# Patient Record
Sex: Female | Born: 1965 | Race: Black or African American | Hispanic: No | Marital: Single | Smoking: Never smoker
Health system: Southern US, Community
[De-identification: ages and names within clinical notes are randomized; demographics above are authoritative.]

## PROBLEM LIST (undated history)

## (undated) DIAGNOSIS — E119 Type 2 diabetes mellitus without complications: Secondary | ICD-10-CM

## (undated) DIAGNOSIS — I10 Essential (primary) hypertension: Secondary | ICD-10-CM

## (undated) HISTORY — PX: TUBAL LIGATION: SHX77

## (undated) HISTORY — PX: TONSILLECTOMY: SUR1361

## (undated) HISTORY — PX: FOOT SURGERY: SHX648

## (undated) HISTORY — PX: CHOLECYSTECTOMY: SHX55

## (undated) HISTORY — PX: THYROID SURGERY: SHX805

---

## 2003-11-19 ENCOUNTER — Emergency Department (HOSPITAL_COMMUNITY): Admission: EM | Admit: 2003-11-19 | Discharge: 2003-11-19 | Payer: Self-pay | Admitting: Emergency Medicine

## 2008-07-29 ENCOUNTER — Emergency Department (HOSPITAL_BASED_OUTPATIENT_CLINIC_OR_DEPARTMENT_OTHER): Admission: EM | Admit: 2008-07-29 | Discharge: 2008-07-29 | Payer: Self-pay | Admitting: Emergency Medicine

## 2008-07-29 ENCOUNTER — Ambulatory Visit: Payer: Self-pay | Admitting: Diagnostic Radiology

## 2008-08-29 ENCOUNTER — Emergency Department (HOSPITAL_BASED_OUTPATIENT_CLINIC_OR_DEPARTMENT_OTHER): Admission: EM | Admit: 2008-08-29 | Discharge: 2008-08-29 | Payer: Self-pay | Admitting: Emergency Medicine

## 2008-09-21 ENCOUNTER — Emergency Department (HOSPITAL_BASED_OUTPATIENT_CLINIC_OR_DEPARTMENT_OTHER): Admission: EM | Admit: 2008-09-21 | Discharge: 2008-09-21 | Payer: Self-pay | Admitting: Emergency Medicine

## 2010-01-25 ENCOUNTER — Emergency Department (HOSPITAL_BASED_OUTPATIENT_CLINIC_OR_DEPARTMENT_OTHER): Admission: EM | Admit: 2010-01-25 | Discharge: 2010-01-25 | Payer: Self-pay | Admitting: Emergency Medicine

## 2010-11-13 LAB — URINALYSIS, ROUTINE W REFLEX MICROSCOPIC
Nitrite: NEGATIVE
Specific Gravity, Urine: 1.021 (ref 1.005–1.030)
Urobilinogen, UA: 0.2 mg/dL (ref 0.0–1.0)

## 2010-11-13 LAB — URINE MICROSCOPIC-ADD ON

## 2010-11-13 LAB — PREGNANCY, URINE: Preg Test, Ur: NEGATIVE

## 2010-11-13 LAB — GLUCOSE, CAPILLARY

## 2010-11-23 ENCOUNTER — Emergency Department (HOSPITAL_BASED_OUTPATIENT_CLINIC_OR_DEPARTMENT_OTHER)
Admission: EM | Admit: 2010-11-23 | Discharge: 2010-11-23 | Disposition: A | Discharge status: Home / Self Care | Payer: Commercial Managed Care - PPO | Attending: Emergency Medicine | Admitting: Emergency Medicine

## 2010-11-23 DIAGNOSIS — H60399 Other infective otitis externa, unspecified ear: Secondary | ICD-10-CM | POA: Insufficient documentation

## 2010-11-23 DIAGNOSIS — H9209 Otalgia, unspecified ear: Secondary | ICD-10-CM | POA: Insufficient documentation

## 2010-11-23 DIAGNOSIS — I10 Essential (primary) hypertension: Secondary | ICD-10-CM | POA: Insufficient documentation

## 2011-02-21 ENCOUNTER — Emergency Department (INDEPENDENT_AMBULATORY_CARE_PROVIDER_SITE_OTHER): Payer: Commercial Managed Care - PPO

## 2011-02-21 ENCOUNTER — Emergency Department (HOSPITAL_BASED_OUTPATIENT_CLINIC_OR_DEPARTMENT_OTHER)
Admission: EM | Admit: 2011-02-21 | Discharge: 2011-02-21 | Disposition: A | Discharge status: Home / Self Care | Payer: Commercial Managed Care - PPO | Attending: Emergency Medicine | Admitting: Emergency Medicine

## 2011-02-21 ENCOUNTER — Other Ambulatory Visit (HOSPITAL_BASED_OUTPATIENT_CLINIC_OR_DEPARTMENT_OTHER): Payer: Self-pay | Admitting: Emergency Medicine

## 2011-02-21 ENCOUNTER — Encounter: Payer: Self-pay | Admitting: Emergency Medicine

## 2011-02-21 ENCOUNTER — Ambulatory Visit (INDEPENDENT_AMBULATORY_CARE_PROVIDER_SITE_OTHER)
Admit: 2011-02-21 | Discharge: 2011-02-21 | Disposition: A | Discharge status: Home / Self Care | Payer: Commercial Managed Care - PPO | Attending: Emergency Medicine | Admitting: Emergency Medicine

## 2011-02-21 ENCOUNTER — Emergency Department (HOSPITAL_BASED_OUTPATIENT_CLINIC_OR_DEPARTMENT_OTHER): Payer: Commercial Managed Care - PPO

## 2011-02-21 DIAGNOSIS — M25569 Pain in unspecified knee: Secondary | ICD-10-CM

## 2011-02-21 DIAGNOSIS — M79606 Pain in leg, unspecified: Secondary | ICD-10-CM

## 2011-02-21 DIAGNOSIS — M79609 Pain in unspecified limb: Secondary | ICD-10-CM | POA: Insufficient documentation

## 2011-02-21 DIAGNOSIS — I10 Essential (primary) hypertension: Secondary | ICD-10-CM | POA: Insufficient documentation

## 2011-02-21 DIAGNOSIS — M25559 Pain in unspecified hip: Secondary | ICD-10-CM | POA: Insufficient documentation

## 2011-02-21 DIAGNOSIS — R209 Unspecified disturbances of skin sensation: Secondary | ICD-10-CM

## 2011-02-21 HISTORY — DX: Essential (primary) hypertension: I10

## 2011-02-21 LAB — URINALYSIS, ROUTINE W REFLEX MICROSCOPIC
Bilirubin Urine: NEGATIVE
Protein, ur: NEGATIVE mg/dL
Urobilinogen, UA: 0.2 mg/dL (ref 0.0–1.0)

## 2011-02-21 LAB — BASIC METABOLIC PANEL
BUN: 11 mg/dL (ref 6–23)
Calcium: 9.5 mg/dL (ref 8.4–10.5)
GFR calc non Af Amer: >60 mL/min (ref >60)
Glucose, Bld: 140 mg/dL — ABNORMAL HIGH (ref 70–99)

## 2011-02-21 LAB — CBC
Hemoglobin: 11.2 g/dL — ABNORMAL LOW (ref 12.0–15.0)
MCH: 26.8 pg (ref 26.0–34.0)
MCHC: 33.1 g/dL (ref 30.0–36.0)
Platelets: 206 10*3/uL (ref 150–400)

## 2011-02-21 LAB — URINE MICROSCOPIC-ADD ON

## 2011-02-21 MED ORDER — OXYCODONE-ACETAMINOPHEN 5-325 MG PO TABS
1.0000 | ORAL_TABLET | Freq: Once | ORAL | Status: AC
  Administered 2011-02-21: 1 via ORAL
  Filled 2011-02-21: qty 1

## 2011-02-21 MED ORDER — ENOXAPARIN SODIUM 100 MG/ML ~~LOC~~ SOLN
100.0000 mg | Freq: Once | SUBCUTANEOUS | Status: AC
  Administered 2011-02-21: 100 mg via SUBCUTANEOUS

## 2011-02-21 MED ORDER — HYDROCODONE-ACETAMINOPHEN 5-500 MG PO TABS: 1.0000 | ORAL_TABLET | Freq: Four times a day (QID) | ORAL | Status: DC | PRN

## 2011-02-21 MED ORDER — HYDROCODONE-ACETAMINOPHEN 5-500 MG PO TABS: 1.0000 | ORAL_TABLET | Freq: Four times a day (QID) | ORAL | Status: AC | PRN

## 2011-02-21 MED ORDER — ENOXAPARIN SODIUM 150 MG/ML ~~LOC~~ SOLN: 1.5000 mg/kg | Freq: Once | SUBCUTANEOUS | Status: DC

## 2011-02-21 MED ORDER — ENOXAPARIN SODIUM 100 MG/ML ~~LOC~~ SOLN
SUBCUTANEOUS | Status: AC
  Filled 2011-02-21: qty 1

## 2011-02-21 NOTE — ED Provider Notes (Signed)
Dg Knee 1-2 Views Right  02/21/2011  *RADIOLOGY REPORT*  Clinical Data: Right knee pain anteriorly  RIGHT KNEE - 1-2 VIEW  Comparison: None.  Findings: Calcific density along the patellar tendon insertion versus fragmentation of the tibial tubercle.  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  No joint effusion.  No definite acute fracture or dislocation identified.  IMPRESSION: Calcific density near the patellar tendon insertion, may be sequelae of prior trauma or sequelae of unresolved Osgood-Schlatter disease.  Original Report Authenticated By: Waneta Martins, M.D.   US Venous Img Lower Unilateral Right  02/21/2011  *RADIOLOGY REPORT*  Clinical Data: Burning sensation involving the anterior aspect of the right knee radiating down the leg.  RIGHT LOWER EXTREMITY VENOUS DUPLEX ULTRASOUND  Technique:  Gray-scale sonography with graded compression, as well as color Doppler and duplex ultrasound were performed to evaluate the deep venous system of the lower extremity from the level of the common femoral vein through the popliteal and proximal calf veins. Spectral Doppler was utilized to evaluate flow at rest and with distal augmentation maneuvers.  Comparison:  None.  Findings:  Normal compressibility of the common femoral, superficial femoral, and popliteal veins is demonstrated, as well as the visualized proximal calf veins.  No filling defects to suggest DVT on grayscale or color Doppler imaging.  Doppler waveforms show normal direction of venous flow, normal respiratory phasicity and response to augmentation.  IMPRESSION: No evidence of right lower extremity deep vein thrombosis.  Original Report Authenticated By: Crawford Givens, M.D.    Patient return to the ED for followup ultrasound. Gave her the results of the study. No sign of DVT.  Celene Kras, MD 02/21/11 [phone]

## 2011-02-21 NOTE — ED Provider Notes (Signed)
History     Chief Complaint  Patient presents with  . Leg Pain   Patient is a 45 y.o. female presenting with leg pain. The history is provided by the patient. No language interpreter was used.  Leg Pain  The incident occurred more than 1 week ago. Incident location: no trauma. There was no injury mechanism. The pain is present in the right hip and right knee. The quality of the pain is described as burning. The pain is at a severity of 10/10. The pain is severe. The pain has been constant since onset. Pertinent negatives include no numbness, no inability to bear weight, no muscle weakness, no loss of sensation and no tingling. She reports no foreign bodies present. The symptoms are aggravated by nothing. She has tried nothing for the symptoms. The treatment provided no relief.    Past Medical History  Diagnosis Date  . Hypertension     Past Surgical History  Procedure Date  . Tonsillectomy   . Cholecystectomy   . Tubal ligation   . Thyroid surgery   . Foot surgery     History reviewed. No pertinent family history.  History  Substance Use Topics  . Smoking status: Never Smoker   . Smokeless tobacco: Not on file  . Alcohol Use: No    OB History    Grav Para Term Preterm Abortions TAB SAB Ect Mult Living                  Review of Systems  Constitutional: Negative for activity change.  HENT: Negative for facial swelling.   Eyes: Negative for discharge.  Respiratory: Negative for apnea, cough, chest tightness, shortness of breath, wheezing and stridor.   Cardiovascular: Negative for chest pain and leg swelling.  Gastrointestinal: Negative for abdominal distention.  Genitourinary: Negative for dysuria and difficulty urinating.  Musculoskeletal: Positive for arthralgias. Negative for back pain, joint swelling and gait problem.  Skin: Negative.   Neurological: Negative for tingling and numbness.  Hematological: Negative for adenopathy.  Psychiatric/Behavioral: Negative  for agitation.    Physical Exam  BP 168/104  Pulse 85  Temp(Src) 98.8 F (37.1 C) (Oral)  Resp 18  SpO2 100%  Physical Exam  Constitutional: She is oriented to person, place, and time. She appears well-developed and well-nourished.  HENT:  Head: Normocephalic and atraumatic.  Eyes: EOM are normal. Pupils are equal, round, and reactive to light.  Neck: Normal range of motion. Neck supple.  Cardiovascular: Normal rate and regular rhythm.   No murmur heard. Pulmonary/Chest: Effort normal and breath sounds normal. No respiratory distress.  Abdominal: Soft. Bowel sounds are normal. She exhibits no distension. There is no tenderness. There is no rebound.  Musculoskeletal: Normal range of motion. She exhibits no edema and no tenderness.       B knee exam: B negative anterior and posterior drawer tests, no deformation to varus or valgus stress, intact sensation to all nerve distributions of BLE. Cap refill in all toes < 2 sec.  DTR B knees 2+, no edema.  No homan's sign.  Intact internal external rotation and flexion and extension of B hips L5/s1 intact intact perineal sensation.  No spinal tenderness, gait intact  Neurological: She is alert and oriented to person, place, and time. She has normal reflexes. She displays normal reflexes. Coordination normal.  Skin: Skin is warm and dry. No rash noted. No erythema. No pallor.  Psychiatric: She has a normal mood and affect.    ED Course  Procedures  MDM Patient refused hip Xray.  Will schedule for DVT US in the am.  Intact creatinine.  Patient told to follow up for any concerns.  Verbalizes understanding and agrees to follow up in the am for [place]  Patient informed of the results of XRay verbatim from report.  No acute injury.  Patient verbalizes understanding but does not recall the previous trauma.  Feels improved and will follow up in the am for vascular ultrasound   Lalo Tromp K Sim Choquette-Rasch, MD 02/21/11 0345

## 2011-02-21 NOTE — ED Notes (Signed)
Pt c/o right leg pain from hip to foot. No known injury.

## 2011-06-06 ENCOUNTER — Other Ambulatory Visit (HOSPITAL_COMMUNITY)
Admission: RE | Admit: 2011-06-06 | Discharge: 2011-06-06 | Disposition: A | Discharge status: Home / Self Care | Payer: Commercial Indemnity | Source: Ambulatory Visit | Attending: Obstetrics and Gynecology | Admitting: Obstetrics and Gynecology

## 2011-06-06 DIAGNOSIS — Z124 Encounter for screening for malignant neoplasm of cervix: Secondary | ICD-10-CM | POA: Insufficient documentation

## 2011-06-06 DIAGNOSIS — Z1159 Encounter for screening for other viral diseases: Secondary | ICD-10-CM | POA: Insufficient documentation

## 2011-11-26 ENCOUNTER — Emergency Department (HOSPITAL_BASED_OUTPATIENT_CLINIC_OR_DEPARTMENT_OTHER)
Admission: EM | Admit: 2011-11-26 | Discharge: 2011-11-27 | Disposition: A | Discharge status: Home / Self Care | Payer: Self-pay | Attending: Emergency Medicine | Admitting: Emergency Medicine

## 2011-11-26 ENCOUNTER — Emergency Department (HOSPITAL_BASED_OUTPATIENT_CLINIC_OR_DEPARTMENT_OTHER): Payer: Self-pay

## 2011-11-26 ENCOUNTER — Encounter (HOSPITAL_BASED_OUTPATIENT_CLINIC_OR_DEPARTMENT_OTHER): Payer: Self-pay | Admitting: Student

## 2011-11-26 DIAGNOSIS — M2550 Pain in unspecified joint: Secondary | ICD-10-CM | POA: Insufficient documentation

## 2011-11-26 DIAGNOSIS — M79609 Pain in unspecified limb: Secondary | ICD-10-CM | POA: Insufficient documentation

## 2011-11-26 DIAGNOSIS — M7989 Other specified soft tissue disorders: Secondary | ICD-10-CM | POA: Insufficient documentation

## 2011-11-26 DIAGNOSIS — Z79899 Other long term (current) drug therapy: Secondary | ICD-10-CM | POA: Insufficient documentation

## 2011-11-26 DIAGNOSIS — L03019 Cellulitis of unspecified finger: Secondary | ICD-10-CM

## 2011-11-26 DIAGNOSIS — I10 Essential (primary) hypertension: Secondary | ICD-10-CM | POA: Insufficient documentation

## 2011-11-26 MED ORDER — ACETAMINOPHEN-CODEINE #3 300-30 MG PO TABS: 1.0000 | ORAL_TABLET | Freq: Four times a day (QID) | ORAL | Status: AC | PRN

## 2011-11-26 MED ORDER — DICLOXACILLIN SODIUM 500 MG PO CAPS: 500.0000 mg | ORAL_CAPSULE | Freq: Three times a day (TID) | ORAL | Status: AC

## 2011-11-26 MED ORDER — HYDROCODONE-ACETAMINOPHEN 5-325 MG PO TABS
2.0000 | ORAL_TABLET | Freq: Once | ORAL | Status: AC
  Administered 2011-11-27: 2 via ORAL
  Filled 2011-11-26: qty 2

## 2011-11-26 NOTE — ED Provider Notes (Signed)
History     CSN: [id number]  Arrival date & time 11/26/11  2134   First MD Initiated Contact with Patient 11/26/11 2306      Chief Complaint  Patient presents with  . Hand Pain    left thumb swelling    (Consider location/radiation/quality/duration/timing/severity/associated sxs/prior treatment) HPI Comments: Pt reports she has had similar problem with pain, swelling and redness to thumb in the past, she attributes to being a nail biter.  At [facility], she had to be numbed and incised to release pus. Has been worsening for a few days.  No fevers.  She used ice to it yesterday and got a small amount of pus to come out, but remains red, swollen and painful.   Mostly affects the ulnar side of thumb.ROM is normal.  No pain to hand, wrist.    Patient is a 46 y.o. female presenting with hand pain. The history is provided by the patient.  Hand Pain    Past Medical History  Diagnosis Date  . Hypertension     Past Surgical History  Procedure Date  . Tonsillectomy   . Cholecystectomy   . Tubal ligation   . Thyroid surgery   . Foot surgery     History reviewed. No pertinent family history.  History  Substance Use Topics  . Smoking status: Never Smoker   . Smokeless tobacco: Not on file  . Alcohol Use: No    OB History    Grav Para Term Preterm Abortions TAB SAB Ect Mult Living                  Review of Systems  Constitutional: Negative.   Musculoskeletal: Positive for arthralgias.  Skin: Positive for wound. Negative for rash.  Neurological: Negative for weakness and numbness.    Allergies  Review of patient's allergies indicates no known allergies.  Home Medications   Current Outpatient Rx  Name Route Sig Dispense Refill  . HYDROCHLOROTHIAZIDE 12.5 MG PO CAPS Oral Take 12.5 mg by mouth daily.    . ACETAMINOPHEN-CODEINE #3 300-30 MG PO TABS Oral Take 1-2 tablets by mouth every 6 (six) hours as needed for pain. 15 tablet 0  . AMOXICILLIN 500 MG PO CAPS Oral  Take by mouth.      . ATENOLOL 100 MG PO TABS Oral Take by mouth daily.      Marland Kitchen DICLOXACILLIN SODIUM 500 MG PO CAPS Oral Take 1 capsule (500 mg total) by mouth 3 (three) times daily. 21 capsule 0    BP 153/82  Pulse 82  Temp(Src) 98.1 F (36.7 C) (Oral)  Resp 20  SpO2 100%  LMP 09/26/2011  Physical Exam  Nursing note and vitals reviewed. Constitutional: She appears well-developed. No distress.  Musculoskeletal: She exhibits tenderness.       Hands: Neurological: She is alert.  Skin: Skin is warm and dry. No rash noted. She is not diaphoretic.    ED Course  Procedures (including critical care time)  Labs Reviewed - No data to display No results found.   1. Paronychia of thumb       MDM  Discussed performing a digital block in order to do a finger procedure to try to incise along margin of nail to assess for abscess.  Pt would rather try conservative warm soaks at home, oral abx and analgesics for now . She is told to return for worsening symptoms, or if symptoms are not improving with above therapies.  She is also given option  to see PCP or urgent care as well.  She may require nail partial removal if ingrown nail is present.          Gavin Pound. Oletta Lamas, MD 11/26/11 [phone]

## 2011-11-26 NOTE — Discharge Instructions (Signed)
Paronychia Paronychia is an inflammatory reaction involving the folds of the skin surrounding the fingernail. This is commonly caused by an infection in the skin around a nail. The most common cause of paronychia is frequent wetting of the hands (as seen with bartenders, food servers, nurses or others who wet their hands). This makes the skin around the fingernail susceptible to infection by bacteria (germs) or fungus. Other predisposing factors are:  Aggressive manicuring.   Nail biting.   Thumb sucking.  The most common cause is a staphylococcal (a type of germ) infection, or a fungal (Candida) infection. When caused by a germ, it usually comes on suddenly with redness, swelling, pus and is often painful. It may get under the nail and form an abscess (collection of pus), or form an abscess around the nail. If the nail itself is infected with a fungus, the treatment is usually prolonged and may require oral medicine for up to one year. Your caregiver will determine the length of time treatment is required. The paronychia caused by bacteria (germs) may largely be avoided by not pulling on hangnails or picking at cuticles. When the infection occurs at the tips of the finger it is called felon. When the cause of paronychia is from the herpes simplex virus (HSV) it is called herpetic whitlow. TREATMENT  When an abscess is present treatment is often incision and drainage. This means that the abscess must be cut open so the pus can get out. When this is done, the following home care instructions should be followed. HOME CARE INSTRUCTIONS   It is important to keep the affected fingers very dry. Rubber or plastic gloves over cotton gloves should be used whenever the hand must be placed in water.   Keep wound clean, dry and dressed as suggested by your caregiver between warm soaks or warm compresses.   Soak in warm water for fifteen to twenty minutes three to four times per day for bacterial infections.  Fungal infections are very difficult to treat, so often require treatment for long periods of time.   For bacterial (germ) infections take antibiotics (medicine which kill germs) as directed and finish the prescription, even if the problem appears to be solved before the medicine is gone.   Only take over-the-counter or prescription medicines for pain, discomfort, or fever as directed by your caregiver.  SEEK IMMEDIATE MEDICAL CARE IF:  You have redness, swelling, or increasing pain in the wound.   You have a fever.  Document Released: 01/08/2001 Document Revised: 07/04/2011 Document Reviewed: 09/09/2008 [facility] Patient Information 2012 [place], [place].

## 2011-11-26 NOTE — ED Notes (Signed)
Swelling and pain to left thumb

## 2013-01-02 DIAGNOSIS — I10 Essential (primary) hypertension: Secondary | ICD-10-CM | POA: Insufficient documentation

## 2013-01-02 DIAGNOSIS — J302 Other seasonal allergic rhinitis: Secondary | ICD-10-CM | POA: Insufficient documentation

## 2013-01-02 DIAGNOSIS — J4520 Mild intermittent asthma, uncomplicated: Secondary | ICD-10-CM | POA: Insufficient documentation

## 2013-07-14 ENCOUNTER — Emergency Department (HOSPITAL_BASED_OUTPATIENT_CLINIC_OR_DEPARTMENT_OTHER): Payer: Self-pay

## 2013-07-14 ENCOUNTER — Emergency Department (HOSPITAL_BASED_OUTPATIENT_CLINIC_OR_DEPARTMENT_OTHER)
Admission: EM | Admit: 2013-07-14 | Discharge: 2013-07-14 | Disposition: A | Discharge status: Home / Self Care | Payer: Self-pay | Attending: Emergency Medicine | Admitting: Emergency Medicine

## 2013-07-14 ENCOUNTER — Encounter (HOSPITAL_BASED_OUTPATIENT_CLINIC_OR_DEPARTMENT_OTHER): Payer: Self-pay | Admitting: Emergency Medicine

## 2013-07-14 DIAGNOSIS — H9209 Otalgia, unspecified ear: Secondary | ICD-10-CM | POA: Insufficient documentation

## 2013-07-14 DIAGNOSIS — Z79899 Other long term (current) drug therapy: Secondary | ICD-10-CM | POA: Insufficient documentation

## 2013-07-14 DIAGNOSIS — Z9889 Other specified postprocedural states: Secondary | ICD-10-CM | POA: Insufficient documentation

## 2013-07-14 DIAGNOSIS — I10 Essential (primary) hypertension: Secondary | ICD-10-CM | POA: Insufficient documentation

## 2013-07-14 DIAGNOSIS — J069 Acute upper respiratory infection, unspecified: Secondary | ICD-10-CM | POA: Insufficient documentation

## 2013-07-14 MED ORDER — IBUPROFEN 400 MG PO TABS
400.0000 mg | ORAL_TABLET | Freq: Once | ORAL | Status: AC
  Administered 2013-07-14: 400 mg via ORAL

## 2013-07-14 MED ORDER — IBUPROFEN 200 MG PO TABS
ORAL_TABLET | ORAL | Status: AC
  Administered 2013-07-14: 400 mg via ORAL
  Filled 2013-07-14: qty 2

## 2013-07-14 MED ORDER — ALBUTEROL SULFATE HFA 108 (90 BASE) MCG/ACT IN AERS
2.0000 | INHALATION_SPRAY | RESPIRATORY_TRACT | Status: DC | PRN
  Administered 2013-07-14: 2 via RESPIRATORY_TRACT
  Filled 2013-07-14: qty 6.7

## 2013-07-14 MED ORDER — HYDROCOD POLST-CHLORPHEN POLST 10-8 MG/5ML PO LQCR: 5.0000 mL | Freq: Two times a day (BID) | ORAL | Status: DC | PRN

## 2013-07-14 NOTE — ED Notes (Signed)
Onset of cough Sunday.  C/o chest congestion.  C/o left ear pain also.  Reports fever at home, using ibuprofen.

## 2013-07-14 NOTE — ED Provider Notes (Signed)
CSN: [id number]     Arrival date & time 07/14/13  2040 History  This chart was scribed for Geoffery Lyons, MD by Landis Gandy, ED Scribe. This patient was seen in room MH01/MH01 and the patient's care was started at 8:56 PM.    Chief Complaint  Patient presents with  . Cough    The history is provided by the patient. No language interpreter was used.   HPI Comments: Krystal Castillo is a 47 y.o. female who presents to the Emergency Department complaining of a new, constant, gradually worsening productive cough onset, three days. She states that she takes cough medicine to relieve the symptoms. She reports that her associated symptoms are a fever, difficulty breathing, headaches, and ear pain without drainage. She denies having a flu shot this year.     Past Medical History  Diagnosis Date  . Hypertension    Past Surgical History  Procedure Laterality Date  . Tonsillectomy    . Cholecystectomy    . Tubal ligation    . Thyroid surgery    . Foot surgery     No family history on file. History  Substance Use Topics  . Smoking status: Never Smoker   . Smokeless tobacco: Not on file  . Alcohol Use: No   OB History   Grav Para Term Preterm Abortions TAB SAB Ect Mult Living                 Review of Systems A complete 10 system review of systems was obtained and all systems are negative except as noted in the HPI and PMH.    Allergies  Review of patient's allergies indicates no known allergies.  Home Medications   Current Outpatient Rx  Name  Route  Sig  Dispense  Refill  . atenolol (TENORMIN) 100 MG tablet   Oral   Take by mouth daily.           . hydrochlorothiazide (MICROZIDE) 12.5 MG capsule   Oral   Take 12.5 mg by mouth daily.          Triage Vitals: BP 187/82  Pulse 96  Temp(Src) 100.2 F (37.9 C) (Oral)  Resp 20  Ht 5\' 6"  (1.676 m)  Wt 232 lb (105.235 kg)  BMI 37.46 kg/m2  SpO2 100%  LMP 07/04/2013 Physical Exam  Nursing note and vitals  reviewed. Constitutional: She is oriented to person, place, and time. She appears well-developed and well-nourished. No distress.  HENT:  Head: Normocephalic and atraumatic.  Bilateral TM's are clear.   Eyes: Conjunctivae and EOM are normal. No scleral icterus.  Neck: Normal range of motion.  Cardiovascular: Normal rate, regular rhythm and normal heart sounds.   Pulmonary/Chest: Effort normal and breath sounds normal. No respiratory distress. She has no wheezes.  Musculoskeletal: Normal range of motion.  Neurological: She is alert and oriented to person, place, and time.  Skin: Skin is warm and dry. No rash noted. She is not diaphoretic. No erythema. No pallor.  Psychiatric: She has a normal mood and affect. Her behavior is normal.    ED Course  Procedures (including critical care time) DIAGNOSTIC STUDIES: Oxygen Saturation is 100% on RA, normal by my interpretation.    COORDINATION OF CARE: 8:59 PM- CXR ordered. Will discharge patient with congestion medication. Pt advised of plan for treatment and pt agrees.  Labs Review Labs Reviewed - No data to display Imaging Review No results found.    MDM  No diagnosis found. Patient presents with  complaints of cough and congestion for the past 3 or 4 days. Her lungs are clear and chest x-ray does not reveal pneumonia. She is febrile in the emergency department and I suspect this is a viral process. I will prescribe a cough syrup and an albuterol inhaler to use as needed. She is to return to the ER if her symptoms worsen or she develops new or concerning symptoms.    I personally performed the services described in this documentation, which was scribed in my presence. The recorded information has been reviewed and is accurate.        Geoffery Lyons, MD 07/14/13 2138

## 2013-07-14 NOTE — ED Notes (Signed)
Patient transported to X-ray 

## 2014-09-16 ENCOUNTER — Emergency Department (HOSPITAL_BASED_OUTPATIENT_CLINIC_OR_DEPARTMENT_OTHER)
Admission: EM | Admit: 2014-09-16 | Discharge: 2014-09-16 | Disposition: A | Discharge status: Home / Self Care | Payer: 59 | Attending: Emergency Medicine | Admitting: Emergency Medicine

## 2014-09-16 ENCOUNTER — Encounter (HOSPITAL_BASED_OUTPATIENT_CLINIC_OR_DEPARTMENT_OTHER): Payer: Self-pay

## 2014-09-16 ENCOUNTER — Emergency Department (HOSPITAL_BASED_OUTPATIENT_CLINIC_OR_DEPARTMENT_OTHER): Payer: 59

## 2014-09-16 DIAGNOSIS — Z79899 Other long term (current) drug therapy: Secondary | ICD-10-CM | POA: Diagnosis not present

## 2014-09-16 DIAGNOSIS — M79604 Pain in right leg: Secondary | ICD-10-CM | POA: Insufficient documentation

## 2014-09-16 DIAGNOSIS — R2241 Localized swelling, mass and lump, right lower limb: Secondary | ICD-10-CM | POA: Diagnosis present

## 2014-09-16 DIAGNOSIS — I10 Essential (primary) hypertension: Secondary | ICD-10-CM | POA: Diagnosis not present

## 2014-09-16 MED ORDER — TRAMADOL HCL 50 MG PO TABS: 50.0000 mg | ORAL_TABLET | Freq: Four times a day (QID) | ORAL | Status: DC | PRN

## 2014-09-16 NOTE — Discharge Instructions (Signed)
Ibuprofen 600 mg 3 times daily for the next 5 days.  Tramadol as prescribed as needed for pain not relieved with ibuprofen.  Follow-up with your primary Dr. if not improving in the next week, and return to the ER if your symptoms substantially worsen or change.   Musculoskeletal Pain Musculoskeletal pain is muscle and boney aches and pains. These pains can occur in any part of the body. Your caregiver may treat you without knowing the cause of the pain. They may treat you if blood or urine tests, X-rays, and other tests were normal.  CAUSES There is often not a definite cause or reason for these pains. These pains may be caused by a type of germ (virus). The discomfort may also come from overuse. Overuse includes working out too hard when your body is not fit. Boney aches also come from weather changes. Bone is sensitive to atmospheric pressure changes. HOME CARE INSTRUCTIONS   Ask when your test results will be ready. Make sure you get your test results.  Only take over-the-counter or prescription medicines for pain, discomfort, or fever as directed by your caregiver. If you were given medications for your condition, do not drive, operate machinery or power tools, or sign legal documents for 24 hours. Do not drink alcohol. Do not take sleeping pills or other medications that may interfere with treatment.  Continue all activities unless the activities cause more pain. When the pain lessens, slowly resume normal activities. Gradually increase the intensity and duration of the activities or exercise.  During periods of severe pain, bed rest may be helpful. Lay or sit in any position that is comfortable.  Putting ice on the injured area.  Put ice in a bag.  Place a towel between your skin and the bag.  Leave the ice on for 15 to 20 minutes, 3 to 4 times a day.  Follow up with your caregiver for continued problems and no reason can be found for the pain. If the pain becomes worse or does not  go away, it may be necessary to repeat tests or do additional testing. Your caregiver may need to look further for a possible cause. SEEK IMMEDIATE MEDICAL CARE IF:  You have pain that is getting worse and is not relieved by medications.  You develop chest pain that is associated with shortness or breath, sweating, feeling sick to your stomach (nauseous), or throw up (vomit).  Your pain becomes localized to the abdomen.  You develop any new symptoms that seem different or that concern you. MAKE SURE YOU:   Understand these instructions.  Will watch your condition.  Will get help right away if you are not doing well or get worse. Document Released: 07/15/2005 Document Revised: 10/07/2011 Document Reviewed: 03/19/2013 [facility] Patient Information 2015 [place], [place]. This information is not intended to replace advice given to you by your health care provider. Make sure you discuss any questions you have with your health care provider.

## 2014-09-16 NOTE — ED Notes (Signed)
C/o pain, swelling to right leg x 1-1.5 weeks-denies injury

## 2014-09-16 NOTE — ED Provider Notes (Signed)
CSN: [id number]     Arrival date & time 09/16/14  2047 History  This chart was scribed for Geoffery Lyonsouglas Carel Carrier, MD by Annye AsaAnna Dorsett, ED Scribe. This patient was seen in room MH10/MH10 and the patient's care was started at 9:52 PM.    Chief Complaint  Patient presents with  . Leg Swelling   The history is provided by the patient. No language interpreter was used.     HPI Comments: Krystal Castillo is a 49 y.o. female with past medical history of HTN who presents to the Emergency Department complaining of 10 days of sharp, "shooting" right leg pain and swelling (worst in right quad, extending down to the right ankle). Her pain is worse when standing. After extended movement, the sensation is described as "burning." She denies chest pain, SOB.   Past Medical History  Diagnosis Date  . Hypertension    Past Surgical History  Procedure Laterality Date  . Tonsillectomy    . Cholecystectomy    . Tubal ligation    . Thyroid surgery    . Foot surgery     No family history on file. History  Substance Use Topics  . Smoking status: Never Smoker   . Smokeless tobacco: Not on file  . Alcohol Use: No   OB History    No data available     Review of Systems  A complete 10 system review of systems was obtained and all systems are negative except as noted in the HPI and PMH.    Allergies  Review of patient's allergies indicates no known allergies.  Home Medications   Prior to Admission medications   Medication Sig Start Date End Date Taking? Authorizing Provider  atenolol (TENORMIN) 100 MG tablet Take by mouth daily.      Historical Provider, MD  hydrochlorothiazide (MICROZIDE) 12.5 MG capsule Take 12.5 mg by mouth daily.    Historical Provider, MD   BP 156/90 mmHg  Pulse 78  Temp(Src) 99 F (37.2 C) (Oral)  Resp 18  Ht 5\' 6"  (1.676 m)  Wt 230 lb (104.327 kg)  BMI 37.14 kg/m2  SpO2 100%  LMP 08/26/2014 Physical Exam  Constitutional: She is oriented to person, place, and time. She appears  well-developed and well-nourished. No distress.  HENT:  Head: Normocephalic and atraumatic.  Mouth/Throat: Oropharynx is clear and moist. No oropharyngeal exudate.  Eyes: EOM are normal. Pupils are equal, round, and reactive to light.  Neck: Normal range of motion. Neck supple.  Cardiovascular: Normal rate, regular rhythm and normal heart sounds.  Exam reveals no gallop and no friction rub.   No murmur heard. Pulmonary/Chest: Effort normal. No respiratory distress. She has no wheezes. She has no rales.  Abdominal: Soft. There is no tenderness. There is no rebound and no guarding.  Musculoskeletal: Normal range of motion. She exhibits no edema or tenderness.  Right lower extremity does not appear significantly swollen. There is no calf tenderness and Homan's sign is absent. Distal pulses, motor and sensory are intact.   Neurological: She is alert and oriented to person, place, and time.  Skin: Skin is warm and dry. No rash noted.  Psychiatric: She has a normal mood and affect. Her behavior is normal.  Nursing note and vitals reviewed.   ED Course  Procedures   DIAGNOSTIC STUDIES: Oxygen Saturation is 100% on RA, normal by my interpretation.    COORDINATION OF CARE: 9:58 PM Discussed treatment plan with pt at bedside and pt agreed to plan.   Labs  Review Labs Reviewed - No data to display  Imaging Review No results found.   EKG Interpretation None      MDM   Final diagnoses:  None    Patient presents with a ten-day history of pain and swelling to her right leg. Her pain is from just above the knee all the way down to the ankle. This started in the absence of any injury or trauma. Her vital signs and physical examination is essentially unremarkable. I am unable to appreciate any significant swelling. Her ultrasound is negative for DVT. I suspect some musculoskeletal etiology and will recommend anti-inflammatories and when necessary return.   I personally performed the  services described in this documentation, which was scribed in my presence. The recorded information has been reviewed and is accurate.      Geoffery Lyons, MD 09/16/14 2306

## 2015-04-24 ENCOUNTER — Encounter (HOSPITAL_BASED_OUTPATIENT_CLINIC_OR_DEPARTMENT_OTHER): Payer: Self-pay | Admitting: *Deleted

## 2015-04-24 ENCOUNTER — Emergency Department (HOSPITAL_BASED_OUTPATIENT_CLINIC_OR_DEPARTMENT_OTHER)
Admission: EM | Admit: 2015-04-24 | Discharge: 2015-04-24 | Disposition: A | Discharge status: Home / Self Care | Payer: 59 | Attending: Physician Assistant | Admitting: Physician Assistant

## 2015-04-24 DIAGNOSIS — K0381 Cracked tooth: Secondary | ICD-10-CM | POA: Insufficient documentation

## 2015-04-24 DIAGNOSIS — Z79899 Other long term (current) drug therapy: Secondary | ICD-10-CM | POA: Diagnosis not present

## 2015-04-24 DIAGNOSIS — I10 Essential (primary) hypertension: Secondary | ICD-10-CM | POA: Diagnosis not present

## 2015-04-24 DIAGNOSIS — K0889 Other specified disorders of teeth and supporting structures: Secondary | ICD-10-CM

## 2015-04-24 DIAGNOSIS — K088 Other specified disorders of teeth and supporting structures: Secondary | ICD-10-CM | POA: Insufficient documentation

## 2015-04-24 MED ORDER — PENICILLIN V POTASSIUM 500 MG PO TABS: 500.0000 mg | ORAL_TABLET | Freq: Four times a day (QID) | ORAL | Status: DC

## 2015-04-24 MED ORDER — NAPROXEN 250 MG PO TABS
250.0000 mg | ORAL_TABLET | Freq: Once | ORAL | Status: AC
  Administered 2015-04-24: 250 mg via ORAL
  Filled 2015-04-24: qty 1

## 2015-04-24 MED ORDER — NAPROXEN 250 MG PO TABS
250.0000 mg | ORAL_TABLET | Freq: Two times a day (BID) | ORAL | Status: DC
Start: 2015-04-24 — End: 2015-04-24

## 2015-04-24 MED ORDER — NAPROXEN 250 MG PO TABS: 250.0000 mg | ORAL_TABLET | Freq: Two times a day (BID) | ORAL | Status: DC

## 2015-04-24 NOTE — Discharge Instructions (Signed)
Dental Pain °A tooth ache may be caused by cavities (tooth decay). Cavities expose the nerve of the tooth to air and hot or cold temperatures. It may come from an infection or abscess (also called a boil or furuncle) around your tooth. It is also often caused by dental caries (tooth decay). This causes the pain you are having. °DIAGNOSIS  °Your caregiver can diagnose this problem by exam. °TREATMENT  °· If caused by an infection, it may be treated with medications which kill germs (antibiotics) and pain medications as prescribed by your caregiver. Take medications as directed. °· Only take over-the-counter or prescription medicines for pain, discomfort, or fever as directed by your caregiver. °· Whether the tooth ache today is caused by infection or dental disease, you should see your dentist as soon as possible for further care. °SEEK MEDICAL CARE IF: °The exam and treatment you received today has been provided on an emergency basis only. This is not a substitute for complete medical or dental care. If your problem worsens or new problems (symptoms) appear, and you are unable to meet with your dentist, call or return to this location. °SEEK IMMEDIATE MEDICAL CARE IF:  °· You have a fever. °· You develop redness and swelling of your face, jaw, or neck. °· You are unable to open your mouth. °· You have severe pain uncontrolled by pain medicine. °MAKE SURE YOU:  °· Understand these instructions. °· Will watch your condition. °· Will get help right away if you are not doing well or get worse. °Document Released: 07/15/2005 Document Revised: 10/07/2011 Document Reviewed: 03/02/2008 °ExitCare® Patient Information ©2015 [employer]. This information is not intended to replace advice given to you by your health care provider. Make sure you discuss any questions you have with your health care provider. ° °Emergency Department Resource Guide °1) Find a Doctor and Pay Out of Pocket °Although you won't have to find out who  is covered by your insurance plan, it is a good idea to ask around and get recommendations. You will then need to call the office and see if the doctor you have chosen will accept you as a new patient and what types of options they offer for patients who are self-pay. Some doctors offer discounts or will set up payment plans for their patients who do not have insurance, but you will need to ask so you aren't surprised when you get to your appointment. ° °2) Contact Your Local Health Department °Not all health departments have doctors that can see patients for sick visits, but many do, so it is worth a call to see if yours does. If you don't know where your local health department is, you can check in your phone book. The CDC also has a tool to help you locate your state's health department, and many state websites also have listings of all of their local health departments. ° °3) Find a Walk-in Clinic °If your illness is not likely to be very severe or complicated, you may want to try a walk in clinic. These are popping up all over the country in pharmacies, drugstores, and shopping centers. They're usually staffed by nurse practitioners or physician assistants that have been trained to treat common illnesses and complaints. They're usually fairly quick and inexpensive. However, if you have serious medical issues or chronic medical problems, these are probably not your best option. ° °No Primary Care Doctor: °- Call [facility] at  832-8000 - they can help you locate a primary   care doctor that  accepts your insurance, provides certain services, etc. °- Physician Referral Service- 1-800-533-3463 ° °Chronic Pain Problems: °Organization         Address  Phone   Notes  °[facility]  (336) 297-2271 Patients need to be referred by their primary care doctor.  ° °Medication Assistance: °Organization         Address  Phone   Notes  °[place] [facility] [address]  [address] °[place], [place] [address] (336) 641-8030 --Must be a resident of [place] °-- Must have NO insurance coverage whatsoever (no Medicaid/ Medicare, etc.) °-- The pt. MUST have a primary care doctor that directs their care regularly and follows them in the community °  °[facility]  (866) 331-1348   °[employer]  (888) 892-1162   ° °Agencies that provide inexpensive medical care: °Organization         Address  Phone   Notes  °[facility]  (336) 832-8035   °[facility]    (336) 832-7272   °[facility] [address] °[place], [place] [address] (336) 832-4777   °[facility] [address], °[place] (336) 271-4999   °[facility]    (336) 373-0678   °[facility]    (336) 272-1050   °[facility] ° [address], [place] Phone:  (336) 832-4444, Fax:  (336) 832-4440 Hours of Operation:  9 am - 6 pm, M-F.  Also accepts Medicaid/Medicare and self-pay.  °[facility] ° [address], [place] Phone: (336) 832-3150, Fax: (336) 832-3151. Hours of Operation:  8:30 am - 5:30 pm, M-F.  Also accepts Medicaid and self-pay.  °[facility] [place] [address], [place] Phone: (336) 878-6027   °[facility] [address], [place], [place] (336)723-1848, Ext. 123 Mondays & Thursdays: 7-9 AM.  First 15 patients are seen on a first come, first serve basis. °  ° °Medicaid-accepting [place] Providers: ° °Organization         Address  Phone   Notes  °[facility] [address], [place] (336) 641-2100 Also accepts self-pay patients.  °[facility] [address], [place] ° (336) 856-9996   °[facility] [address], [place] (336) 288-8857   °[facility] [address] [place] [address], [place] (336) 299-7000   °Veita Bland [address], [place]  ° (336) 373-1557 Only accepts [place] Access Medicaid patients after they have their name applied to their card.  ° °Self-Pay (no insurance) in [place]: ° °Organization         Address  Phone   Notes  °Sickle Cell Patients, [facility] [address], [place] (336) 832-1970   °[facility] [address], [place] (336) 832-4400   ° [facility] [place] ° [address], [place] (336) 992-4800   °[facility]/Dr. Osei-Bonsu ° [address] [place] [address], [place] or [address], [place] (336) 841-8500 Phone number for both [place] and [place] locations is the same.  °[facility] [address], [place] (336) 299-0000   °[facility] [address] [place] [address],  or [address] (336) 852-7530 °(336) 878-2260   °  [facility] [address], [place] (336) 350-1642, phone; (336) 294-5005, fax Sees patients 1st and 3rd Saturday of every month.  Must not qualify for public or private insurance (i.e. Medicaid, Medicare, [place] Health Choice, Veterans' Benefits) • Household income should be no more than 200% of the poverty level •The clinic cannot treat you if you are pregnant or think you are pregnant • Sexually transmitted diseases are not treated at the clinic.  ° ° °Dental Care: °Organization         Address  Phone  Notes  °[facility] [address], [place] (336) 641-6152 Accepts children up to age 21 who are enrolled in Medicaid or [place] Health Choice; pregnant women with a Medicaid card; and children who have applied for Medicaid or [place] Health Choice, but were declined, whose parents can pay a reduced fee at time of service.  °[facility] [place]  [address], [place] (336) 641-7733 Accepts children up to age 21 who are  enrolled in Medicaid or [place] Health Choice; pregnant women with a Medicaid card; and children who have applied for Medicaid or [place] Health Choice, but were declined, whose parents can pay a reduced fee at time of service.  °[facility] ° [address], [place] (336) 641-4533 Patients are seen by appointment only. Walk-ins are not accepted. [facility] will see patients 18 years of age and older. °Monday - Tuesday (8am-5pm) °Most Wednesdays (8:30-5pm) °$30 per visit, cash only  °[facility] ° [address], [place] (336) 641-4533 Patients are seen by appointment only. Walk-ins are not accepted. [facility] will see patients 18 years of age and older. °One Wednesday Evening (Monthly: Volunteer Based).  $30 per visit, cash only  °[facility]  (919) 537-3737 for adults; Children under age 4, call Graduate Pediatric Dentistry at (919) [phone]. Children aged 4-14, please call (919) [phone] to request a pediatric application. ° Dental services are provided in all areas of dental care including fillings, crowns and bridges, complete and partial dentures, implants, gum treatment, root canals, and extractions. Preventive care is also provided. Treatment is provided to both adults and children. °Patients are selected via a lottery and there is often a waiting list. °  °[facility] [address], °[place] ° (336) 763-8833 [email or web address] °  °[facility] [address], [place], [place] (336)723-1848, Ext. 123 Second and Fourth Thursday of each month, opens at 6:30 AM; Clinic ends at 9 AM.  Patients are seen on a first-come first-served basis, and a limited number are seen during each clinic.  ° °[facility] ° [address], [place], [place] (336) 723-7904   Eligibility Requirements °You must have lived in [place], [place], or [place] counties for at least the last three months. °  You  cannot be eligible for state or federal sponsored healthcare insurance, including Veterans Administration, Medicaid, or Medicare. °  You generally cannot be eligible for healthcare insurance through your employer.  °  How to apply: °Eligibility screenings are held every Tuesday and Wednesday afternoon from 1:00 pm until 4:00 pm. You do not need an appointment for the interview!  °[facility] [address], [place], [place] 336-631-2330   °[facility]  336-342-8273   °[facility]  336-703-3100   °[facility]  336-570-6415   ° °Behavioral Health Resources in the Community: °Intensive Outpatient Programs °Organization         Address  Phone  Notes  °[facility] [address], [place], [place] 336-878-6098   °[facility] [address], [place], [place] 336-832-9800   °[facility] [address], [place], [place] ° 336-882-2125   °[facility] [address],  °[place], [place] 1-800-853-5163 or 336-641-4981   °Substance Abuse Resources °Organization         Address  Phone  Notes  °[facility]  336-882-2125   °[facility]  336-784-9470   °[facility]  336-285-9073   °[facility]  336-845-3988   °Residential & Outpatient Substance Abuse Program  1-800-659-3381   °Psychological Services °Organization         Address  Phone  Notes  °[facility]  336- 832-9600   °[facility]  336- 378-7881   °[facility] [address], [place] 1-800-853-5163 or 336-641-4981   ° °Mobile Crisis Teams °Organization         Address  Phone  Notes  °[facility], Mobile Crisis Care Unit  1-877-626-1772   °Assertive °[facility] ° [address] [place], [place] 336-834-9664   °Sharon DeEsch [address] °[place] 336-554-5454   ° °Self-Help/Support  Groups °Organization         Address  Phone             Notes  °[facility] - variety of support groups  336- 373-1402 Call for more information  °Narcotics Anonymous (NA), [facility] [address], °[place]  2 meetings at this location  ° °Residential Treatment Programs °Organization         Address  Phone  Notes  °[facility] [address],    °[place]  1-866-801-8205   °[facility] ° [address], [place], [place] 704-293-8524   °[facility] [address], [place] 336-845-3988 Admissions: 8am-3pm M-F  °[facility] [address]    °[place], [place] 336-841-1104   °[facility] [address], [place], [place] 336-379-7146   °[facility] [address]  °[place], [place] 336-285-9073   °[facility] - Intensive Outpatient [address], [place], [place] 336-852-3033   °[facility] [address]  °[place], [place] 1-877-615-2722 or 336-784-9470   °[facility] [address] [place], [place] 336-227-7417 Accepts Medicaid  °[facility] [address]  °[place] 1-800-659-3381 Substance Abuse/Addiction Treatment  ° °[place] Behavioral Health Resources °Organization         Address  Phone  Notes  °[facility]  (888) 581-9988   °Julie Brannon, PhD [address] [place], [place]   (336) 349-5553 or (336) 951-0000   °[facility]   [address] °[place], [place] (336) 349-4454   °[facility] [address], [place], [place] (336) 342-8316 Insurance/Medicaid/sponsorship through [facility]  °[facility] [address]                                    [place], [place] (336) 342-8316 Therapy/tele-psych/case  °[facility]   [address]  ° [place], [place] (336) 349-2233    °Dr. Arfeen  (336) 349-4544   °[facility] [place]  [facility]  [facility] [place] [facility] 1) [address], [place] °2) [address], [place] °3)  [address], [place] (336) 349-3220 °(336) 342-7768 ° °(336) 342-8140   °[place] [facility] (336) 342-1394 or (336) 342-3537 (After Hours)    ° ° ° °

## 2015-04-24 NOTE — ED Provider Notes (Signed)
CSN: [id number]     Arrival date & time 04/24/15  1321 History   First MD Initiated Contact with Patient 04/24/15 1507     Chief Complaint  Patient presents with  . Dental Pain   Krystal Castillo is a 49 y.o. female who presents to the ED complaining of right upper molar dental pain for the past week. She reports her tooth broke one week ago and now  Her pain has worsened. She currently complains of 6/10 dental pain. She reports she has taken nothing for treatment today.  The patient reports her insurance begins October 15 and she has dental follow-up at that time. The patient denies fevers, neck pain, neck stiffness, discharge from her mouth, double vision, ear pain, ear pressure, sore throat, nausea or vomiting.  (Consider location/radiation/quality/duration/timing/severity/associated sxs/prior Treatment) HPI  Past Medical History  Diagnosis Date  . Hypertension    Past Surgical History  Procedure Laterality Date  . Tonsillectomy    . Cholecystectomy    . Tubal ligation    . Thyroid surgery    . Foot surgery     No family history on file. Social History  Substance Use Topics  . Smoking status: Never Smoker   . Smokeless tobacco: None  . Alcohol Use: No   OB History    No data available     Review of Systems  Constitutional: Negative for fever, chills and appetite change.  HENT: Positive for dental problem. Negative for drooling, ear discharge, ear pain, facial swelling, hearing loss, mouth sores, rhinorrhea, sneezing, sore throat and trouble swallowing.   Eyes: Negative for visual disturbance.  Respiratory: Negative for cough.   Gastrointestinal: Negative for nausea and vomiting.  Musculoskeletal: Negative for neck pain and neck stiffness.  Skin: Negative for rash.  Neurological: Negative for dizziness, light-headedness and headaches.      Allergies  Review of patient's allergies indicates no known allergies.  Home Medications   Prior to Admission medications    Medication Sig Start Date End Date Taking? Authorizing Mera Gunkel  atenolol (TENORMIN) 100 MG tablet Take by mouth daily.      Historical Mercury Rock, MD  hydrochlorothiazide (MICROZIDE) 12.5 MG capsule Take 12.5 mg by mouth daily.    Historical Rosealie Reach, MD  naproxen (NAPROSYN) 250 MG tablet Take 1 tablet (250 mg total) by mouth 2 (two) times daily with a meal. 04/24/15   Everlene Farrier, PA-C  penicillin v potassium (VEETID) 500 MG tablet Take 1 tablet (500 mg total) by mouth 4 (four) times daily. 04/24/15   Everlene Farrier, PA-C  traMADol (ULTRAM) 50 MG tablet Take 1 tablet (50 mg total) by mouth every 6 (six) hours as needed. 09/16/14   Geoffery Lyons, MD   BP 170/89 mmHg  Pulse 76  Temp(Src) 98.6 F (37 C) (Oral)  Resp 20  Ht  (1.702 m)  Wt 230 lb (104.327 kg)  BMI 36.01 kg/m2  SpO2 100%  LMP 04/17/2015 Physical Exam  Constitutional: She is oriented to person, place, and time. She appears well-developed and well-nourished. No distress.  Non-toxic appearing.   HENT:  Head: Normocephalic and atraumatic.  Right Ear: External ear normal.  Left Ear: External ear normal.  Mouth/Throat: Oropharynx is clear and moist. No oropharyngeal exudate.  Tenderness to right upper molar which is cracked. No abscess or induration.  No discharge from the mouth. No facial swelling.  Uvula is midline without edema. Soft palate rises symmetrically. No tonsillar hypertrophy or exudates. Tongue protrusion is normal. No trismus.  Eyes: Conjunctivae and EOM are normal. Pupils are equal, round, and reactive to light. Right eye exhibits no discharge. Left eye exhibits no discharge.  Neck: Normal range of motion. Neck supple. No JVD present. No tracheal deviation present.  Good range of motion of her neck.   Cardiovascular: Normal rate, normal heart sounds and intact distal pulses.   Pulmonary/Chest: Effort normal and breath sounds normal. No respiratory distress. She has no wheezes. She has no rales.  Abdominal:  Soft. There is no tenderness. There is no guarding.  Lymphadenopathy:    She has no cervical adenopathy.  Neurological: She is alert and oriented to person, place, and time. No cranial nerve deficit. Coordination normal.  Skin: Skin is warm and dry. No rash noted. She is not diaphoretic. No erythema. No pallor.  Psychiatric: She has a normal mood and affect. Her behavior is normal.  Nursing note and vitals reviewed.   ED Course  Procedures (including critical care time) Labs Review Labs Reviewed - No data to display  Imaging Review No results found.    EKG Interpretation None     Filed Vitals:   04/24/15 1327  BP: 170/89  Pulse: 76  Temp: 98.6 F (37 C)  TempSrc: Oral  Resp: 20  Height:  (1.702 m)  Weight: 230 lb (104.327 kg)  SpO2: 100%    MDM   Meds given in ED:  Medications  naproxen (NAPROSYN) tablet 250 mg (250 mg Oral Given 04/24/15 1554)    New Prescriptions   NAPROXEN (NAPROSYN) 250 MG TABLET    Take 1 tablet (250 mg total) by mouth 2 (two) times daily with a meal.   PENICILLIN V POTASSIUM (VEETID) 500 MG TABLET    Take 1 tablet (500 mg total) by mouth 4 (four) times daily.    Final diagnoses:  Pain, dental   Patient with right upper toothache.  No gross abscess.  Exam unconcerning for Ludwig's angina or spread of infection.  Will treat with penicillin and pain medicine.  Urged patient to follow-up with dentist and PCP to have blood pressure rechecked. I advised the patient to follow-up with their primary care Klee Kolek this week. I advised the patient to return to the emergency department with new or worsening symptoms or new concerns. The patient verbalized understanding and agreement with plan.      Everlene Farrier, PA-C 04/24/15 1609  Courteney Randall An, MD 04/24/15 2314

## 2015-04-24 NOTE — ED Notes (Signed)
Dental pain. Can see a dentist in October after her insurance is active.

## 2015-12-17 ENCOUNTER — Emergency Department (HOSPITAL_BASED_OUTPATIENT_CLINIC_OR_DEPARTMENT_OTHER)
Admission: EM | Admit: 2015-12-17 | Discharge: 2015-12-17 | Disposition: A | Discharge status: Home / Self Care | Payer: BLUE CROSS/BLUE SHIELD | Attending: Emergency Medicine | Admitting: Emergency Medicine

## 2015-12-17 ENCOUNTER — Encounter (HOSPITAL_BASED_OUTPATIENT_CLINIC_OR_DEPARTMENT_OTHER): Payer: Self-pay | Admitting: Emergency Medicine

## 2015-12-17 DIAGNOSIS — Y999 Unspecified external cause status: Secondary | ICD-10-CM | POA: Insufficient documentation

## 2015-12-17 DIAGNOSIS — I10 Essential (primary) hypertension: Secondary | ICD-10-CM | POA: Diagnosis not present

## 2015-12-17 DIAGNOSIS — S90811A Abrasion, right foot, initial encounter: Secondary | ICD-10-CM | POA: Insufficient documentation

## 2015-12-17 DIAGNOSIS — Y9389 Activity, other specified: Secondary | ICD-10-CM | POA: Diagnosis not present

## 2015-12-17 DIAGNOSIS — Y929 Unspecified place or not applicable: Secondary | ICD-10-CM | POA: Insufficient documentation

## 2015-12-17 DIAGNOSIS — W268XXA Contact with other sharp object(s), not elsewhere classified, initial encounter: Secondary | ICD-10-CM | POA: Insufficient documentation

## 2015-12-17 MED ORDER — NEOMYCIN-POLYMYXIN-PRAMOXINE 1 % EX CREA: TOPICAL_CREAM | Freq: Two times a day (BID) | CUTANEOUS | Status: DC

## 2015-12-17 NOTE — ED Notes (Signed)
Patient states that she had dry skin on her right foot and she tried to get it off by using a razor to "shave the skin off"

## 2015-12-17 NOTE — ED Provider Notes (Signed)
CSN: [id number]     Arrival date & time 12/17/15  0034 History   First MD Initiated Contact with Patient 12/17/15 [phone]     Chief Complaint  Patient presents with  . Abrasion     (Consider location/radiation/quality/duration/timing/severity/associated sxs/prior Treatment) HPI  This is a 50 year old female who was shaven off callused skin from her right foot yesterday afternoon about 1 PM. In the process she went too deep and now has shallow wounds on the medial aspect of her right foot. There is moderate pain associated with this, worse with palpation or movement.  Past Medical History  Diagnosis Date  . Hypertension    Past Surgical History  Procedure Laterality Date  . Tonsillectomy    . Cholecystectomy    . Tubal ligation    . Thyroid surgery    . Foot surgery     History reviewed. No pertinent family history. Social History  Substance Use Topics  . Smoking status: Never Smoker   . Smokeless tobacco: None  . Alcohol Use: No   OB History    No data available     Review of Systems  All other systems reviewed and are negative.   Allergies  Review of patient's allergies indicates no known allergies.  Home Medications   Prior to Admission medications   Medication Sig Start Date End Date Taking? Authorizing Provider  atenolol (TENORMIN) 100 MG tablet Take by mouth daily.      Historical Provider, MD  hydrochlorothiazide (MICROZIDE) 12.5 MG capsule Take 12.5 mg by mouth daily.    Historical Provider, MD  naproxen (NAPROSYN) 250 MG tablet Take 1 tablet (250 mg total) by mouth 2 (two) times daily with a meal. 04/24/15   Everlene FarrierWilliam Dansie, PA-C  penicillin v potassium (VEETID) 500 MG tablet Take 1 tablet (500 mg total) by mouth 4 (four) times daily. 04/24/15   Everlene FarrierWilliam Dansie, PA-C  traMADol (ULTRAM) 50 MG tablet Take 1 tablet (50 mg total) by mouth every 6 (six) hours as needed. 09/16/14   Geoffery Lyonsouglas Delo, MD   BP 165/85 mmHg  Pulse 81  Temp(Src) 98.8 F (37.1 C) (Oral)  Resp  18  Ht 5\' 7"  (1.702 m)  Wt 227 lb (102.967 kg)  BMI 35.55 kg/m2  SpO2 100%  LMP 12/10/2015   Physical Exam  General: Well-developed, well-nourished female in no acute distress; appearance consistent with age of record HENT: normocephalic; atraumatic Eyes: Normal appearance Neck: supple Heart: regular rate and rhythm Lungs: Normal respiratory effort and excursion Abdomen: soft; nondistended Extremities: No deformity; full range of motion; pulses normal Neurologic: Awake, alert and oriented; motor function intact in all extremities and symmetric; no facial droop Skin: Warm and dry; shallow avulsion of skin of medial right foot:   Psychiatric: Normal mood and affect    ED Course  Procedures (including critical care time)   MDM     Paula LibraJohn Markail Diekman, MD 12/17/15 [phone]

## 2016-06-07 ENCOUNTER — Emergency Department (HOSPITAL_BASED_OUTPATIENT_CLINIC_OR_DEPARTMENT_OTHER)
Admission: EM | Admit: 2016-06-07 | Discharge: 2016-06-07 | Disposition: A | Discharge status: Home / Self Care | Payer: Commercial Indemnity | Attending: Emergency Medicine | Admitting: Emergency Medicine

## 2016-06-07 ENCOUNTER — Encounter (HOSPITAL_BASED_OUTPATIENT_CLINIC_OR_DEPARTMENT_OTHER): Payer: Self-pay

## 2016-06-07 DIAGNOSIS — I10 Essential (primary) hypertension: Secondary | ICD-10-CM | POA: Insufficient documentation

## 2016-06-07 DIAGNOSIS — Z79899 Other long term (current) drug therapy: Secondary | ICD-10-CM | POA: Insufficient documentation

## 2016-06-07 DIAGNOSIS — J209 Acute bronchitis, unspecified: Secondary | ICD-10-CM | POA: Insufficient documentation

## 2016-06-07 MED ORDER — AZITHROMYCIN 250 MG PO TABS: ORAL_TABLET | ORAL | 0 refills | Status: DC

## 2016-06-07 MED ORDER — NAPROXEN 250 MG PO TABS
500.0000 mg | ORAL_TABLET | Freq: Once | ORAL | Status: AC
  Administered 2016-06-07: 500 mg via ORAL
  Filled 2016-06-07: qty 2

## 2016-06-07 MED ORDER — HYDROCOD POLST-CPM POLST ER 10-8 MG/5ML PO SUER
5.0000 mL | Freq: Two times a day (BID) | ORAL | 0 refills | Status: DC | PRN
Start: 2016-06-07 — End: 2017-05-14

## 2016-06-07 MED ORDER — ALBUTEROL SULFATE HFA 108 (90 BASE) MCG/ACT IN AERS
2.0000 | INHALATION_SPRAY | RESPIRATORY_TRACT | Status: DC | PRN
  Administered 2016-06-07: 2 via RESPIRATORY_TRACT
  Filled 2016-06-07: qty 6.7

## 2016-06-07 NOTE — ED Notes (Signed)
Pt verbalizes understanding of d/c instructions and denies any further needs at this time. 

## 2016-06-07 NOTE — ED Triage Notes (Signed)
Pt c/o nasal congestion, facial pain, and cough for two weeks not relieved by OTC meds

## 2016-06-07 NOTE — ED Provider Notes (Addendum)
[facility] Provider Note: Lowella DellJ. Lane Teleshia Lemere, MD, FACEP  CSN: [id number] MRN: [id number] ARRIVAL: 06/07/16 at 0558 ROOM: MH01/[facility]01   CHIEF COMPLAINT  Nasal Congestion   HISTORY OF PRESENT ILLNESS  Krystal Castillo is a 50 y.o. female with a two-week history of nasal congestion, sinus drainage, cough and chest tightness. She has an albuterol inhaler which she was using early in the course her illness but she stopped using it because she thought it was getting near empty. (In fact, her inhaler was examined by myself and was marked as expired in April of this year.) She has been taking Mucinex and Flonase without adequate relief. She is also having a headache which she rates as an 8 out of 10 but has not taken anything for it.    Past Medical History:  Diagnosis Date  . Hypertension     Past Surgical History:  Procedure Laterality Date  . CHOLECYSTECTOMY    . FOOT SURGERY    . THYROID SURGERY    . TONSILLECTOMY    . TUBAL LIGATION      No family history on file.  Social History  Substance Use Topics  . Smoking status: Never Smoker  . Smokeless tobacco: Not on file  . Alcohol use No    Prior to Admission medications   Medication Sig Start Date End Date Taking? Authorizing Provider  atenolol (TENORMIN) 100 MG tablet Take by mouth daily.      Historical Provider, MD  azithromycin (ZITHROMAX Z-PAK) 250 MG tablet 2 po day one, then 1 daily x 4 days 06/07/16   Paula LibraJohn Merdis Snodgrass, MD  chlorpheniramine-HYDROcodone [facility](TUSSIONEX PENNKINETIC ER) 10-8 MG/5ML SUER Take 5 mLs by mouth every 12 (twelve) hours as needed. 06/07/16   Shaquille Murdy, MD  hydrochlorothiazide (MICROZIDE) 12.5 MG capsule Take 12.5 mg by mouth daily.    Historical Provider, MD  neomycin-polymyxin-pramoxine (NEOSPORIN PLUS) 1 % cream Apply topically 2 (two) times daily. Apply to foot twice daily. 12/17/15   Paula LibraJohn Margret Moat, MD    Allergies Patient has no known allergies.   REVIEW OF SYSTEMS  Negative except as noted  here or in the History of Present Illness.   PHYSICAL EXAMINATION  Initial Vital Signs Blood pressure 171/100, pulse 80, temperature 98.6 F (37 C), temperature source Oral, resp. rate 18, height 5\' 6"  (1.676 m), weight 220 lb (99.8 kg), last menstrual period 05/12/2016, SpO2 100 %.  Examination General: Well-developed, well-nourished female in no acute distress; appearance consistent with age of record HENT: normocephalic; atraumatic; pharynx normal; frontal and maxillary sinuses tender to percussion Eyes: pupils equal, round and reactive to light; extraocular muscles intact Neck: supple Heart: regular rate and rhythm Lungs: Mildly decreased air movement bilaterally Abdomen: soft; nondistended; nontender Extremities: No deformity; full range of motion; pulses normal Neurologic: Awake, alert and oriented; motor function intact in all extremities and symmetric; no facial droop Skin: Warm and dry Psychiatric: Normal mood and affect   RESULTS  Summary of this visit's results, reviewed by myself:   EKG Interpretation  Date/Time:    Ventricular Rate:    PR Interval:    QRS Duration:   QT Interval:    QTC Calculation:   R Axis:     Text Interpretation:        Laboratory Studies: No results found for this or any previous visit (from the past 24 hour(s)). Imaging Studies: No results found.  ED COURSE  Nursing notes and initial vitals signs, including pulse oximetry, reviewed.  Vitals:  06/07/16 0609 06/07/16 0611 06/07/16 0624  BP: 171/100    Pulse: 80    Resp: 18    Temp: 98.6 F (37 C)    TempSrc: Oral    SpO2: 100%  98%  Weight:  220 lb (99.8 kg)   Height:  5\' 6"  (1.676 m)    6:34 AM Patient given a new inhaler and AeroChamber and instructed in their use by respiratory therapy. The patient felt significant relief, more than she had been accustomed to with her old inhaler.  PROCEDURES    ED DIAGNOSES     ICD-9-CM ICD-10-CM   1. Acute bronchitis with  bronchospasm 466.0 J20.9        Paula LibraJohn Jammal Sarr, MD 06/07/16 [id number]    Paula LibraJohn Voula Waln, MD 06/07/16 [phone]

## 2017-05-14 ENCOUNTER — Encounter (HOSPITAL_BASED_OUTPATIENT_CLINIC_OR_DEPARTMENT_OTHER): Payer: Self-pay | Admitting: Emergency Medicine

## 2017-05-14 ENCOUNTER — Emergency Department (HOSPITAL_BASED_OUTPATIENT_CLINIC_OR_DEPARTMENT_OTHER)
Admission: EM | Admit: 2017-05-14 | Discharge: 2017-05-14 | Disposition: A | Discharge status: Home / Self Care | Payer: Self-pay | Attending: Emergency Medicine | Admitting: Emergency Medicine

## 2017-05-14 ENCOUNTER — Emergency Department (HOSPITAL_BASED_OUTPATIENT_CLINIC_OR_DEPARTMENT_OTHER): Payer: Self-pay

## 2017-05-14 DIAGNOSIS — B349 Viral infection, unspecified: Secondary | ICD-10-CM | POA: Insufficient documentation

## 2017-05-14 DIAGNOSIS — Z79899 Other long term (current) drug therapy: Secondary | ICD-10-CM | POA: Insufficient documentation

## 2017-05-14 DIAGNOSIS — I10 Essential (primary) hypertension: Secondary | ICD-10-CM | POA: Insufficient documentation

## 2017-05-14 DIAGNOSIS — R5383 Other fatigue: Secondary | ICD-10-CM | POA: Insufficient documentation

## 2017-05-14 LAB — BASIC METABOLIC PANEL
Anion gap: 8 (ref 5–15)
BUN: 14 mg/dL (ref 6–20)
CALCIUM: 9.2 mg/dL (ref 8.9–10.3)
CHLORIDE: 99 mmol/L — AB (ref 101–111)
CO2: 27 mmol/L (ref 22–32)
CREATININE: 0.75 mg/dL (ref 0.44–1.00)
GFR calc non Af Amer: >60 mL/min (ref >60)
Glucose, Bld: 229 mg/dL — ABNORMAL HIGH (ref 65–99)
Potassium: 4 mmol/L (ref 3.5–5.1)
SODIUM: 134 mmol/L — AB (ref 135–145)

## 2017-05-14 LAB — URINALYSIS, ROUTINE W REFLEX MICROSCOPIC
BILIRUBIN URINE: NEGATIVE
Glucose, UA: 100 mg/dL — AB
HGB URINE DIPSTICK: NEGATIVE
KETONES UR: NEGATIVE mg/dL
Leukocytes, UA: NEGATIVE
NITRITE: NEGATIVE
PH: 7 (ref 5.0–8.0)
Protein, ur: NEGATIVE mg/dL
SPECIFIC GRAVITY, URINE: 1.02 (ref 1.005–1.030)

## 2017-05-14 LAB — CBC WITH DIFFERENTIAL/PLATELET
Basophils Absolute: 0 10*3/uL (ref 0.0–0.1)
Basophils Relative: 0 %
EOS ABS: 0.2 10*3/uL (ref 0.0–0.7)
EOS PCT: 3 %
HCT: 37.2 % (ref 36.0–46.0)
Hemoglobin: 12.5 g/dL (ref 12.0–15.0)
LYMPHS ABS: 2.4 10*3/uL (ref 0.7–4.0)
LYMPHS PCT: 37 %
MCH: 28.9 pg (ref 26.0–34.0)
MCHC: 33.6 g/dL (ref 30.0–36.0)
MCV: 85.9 fL (ref 78.0–100.0)
Monocytes Absolute: 0.3 10*3/uL (ref 0.1–1.0)
Monocytes Relative: 5 %
Neutro Abs: 3.5 10*3/uL (ref 1.7–7.7)
Neutrophils Relative %: 55 %
PLATELETS: 129 10*3/uL — AB (ref 150–400)
RBC: 4.33 MIL/uL (ref 3.87–5.11)
RDW: 13.5 % (ref 11.5–15.5)
WBC: 6.4 10*3/uL (ref 4.0–10.5)

## 2017-05-14 LAB — TROPONIN I: Troponin I: <0.03 ng/mL (ref <0.03)

## 2017-05-14 NOTE — ED Triage Notes (Signed)
Pt c/o cough and chest tightness.

## 2017-05-14 NOTE — Discharge Instructions (Signed)
Drink plenty of fluids and get plenty of rest. ° °Return to the emergency department if you develop chest pain, difficulty breathing, or other new and concerning symptoms. °

## 2017-05-14 NOTE — ED Notes (Signed)
Pt on amoxicillin for sinus infection currently.

## 2017-05-14 NOTE — ED Triage Notes (Signed)
Pt c/o headache, fatigue and occasional elevated HR. Pt states she was able to work both of her jobs yesterday. Pt denies n/v/d

## 2017-05-14 NOTE — ED Provider Notes (Signed)
[facility] EMERGENCY DEPARTMENT Provider Note   CSN: [id number] Arrival date & time: 05/14/17  0559     History   Chief Complaint Chief Complaint  Patient presents with  . Fatigue    HPI Krystal Castillo is a 51 y.o. female.  Patient is a 51 year old female with past medical history of hypertension presenting today for evaluation of weakness. This started yesterday and she reports chest congestion, cough, weakness, fatigue, and feelings if she is "dehydrated". She denies any specific complaints such as chest pain, abdominal pain, nausea, vomiting, or diarrhea. 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   The history is provided by the patient.    Past Medical History:  Diagnosis Date  . Hypertension     There are no active problems to display for this patient.   Past Surgical History:  Procedure Laterality Date  . CHOLECYSTECTOMY    . FOOT SURGERY    . THYROID SURGERY    . TONSILLECTOMY    . TUBAL LIGATION      OB History    No data available       Home Medications    Prior to Admission medications   Medication Sig Start Date End Date Taking? Authorizing Provider  amoxicillin (AMOXIL) 875 MG tablet Take 875 mg by mouth 2 (two) times daily.   Yes [provider]  atenolol (TENORMIN) 100 MG tablet Take by mouth daily.      [provider]  hydrochlorothiazide (MICROZIDE) 12.5 MG capsule Take 12.5 mg by mouth daily.    [provider]  neomycin-polymyxin-pramoxine (NEOSPORIN PLUS) 1 % cream Apply topically 2 (two) times daily. Apply to foot twice daily. 12/17/15   Molpus, John, MD    Family History No family history on file.  Social History Social History  Substance Use Topics  . Smoking status: Never Smoker  . Smokeless tobacco: Never Used  . Alcohol use No     Allergies   Patient has no known allergies.   Review of Systems Review of Systems  All other systems reviewed and are negative.    Physical  Exam Updated Vital Signs BP (!) 180/96 (BP Location: Right Arm)   Pulse 80   Temp 98.2 F (36.8 C) (Oral)   Resp 18   Ht 5\' 7"  (1.702 m)   SpO2 99%   Physical Exam  Constitutional: She is oriented to person, place, and time. She appears well-developed and well-nourished. No distress.  HENT:  Head: Normocephalic and atraumatic.  Mouth/Throat: Oropharynx is clear and moist.  Neck: Normal range of motion. Neck supple.  Cardiovascular: Normal rate and regular rhythm.  Exam reveals no gallop and no friction rub.   No murmur heard. Pulmonary/Chest: Effort normal and breath sounds normal. No respiratory distress. She has no wheezes.  Abdominal: Soft. Bowel sounds are normal. She exhibits no distension. There is no tenderness.  Musculoskeletal: Normal range of motion. She exhibits no edema.  Neurological: She is alert and oriented to person, place, and time.  Skin: Skin is warm and dry. She is not diaphoretic.  Nursing note and vitals reviewed.    ED Treatments / Results  Labs (all labs ordered are listed, but only abnormal results are displayed) Labs Reviewed  URINALYSIS, ROUTINE W REFLEX MICROSCOPIC - Abnormal; Notable for the following:       Result Value   Glucose, UA 100 (*)    All other components within normal limits  BASIC METABOLIC PANEL  TROPONIN I  CBC WITH DIFFERENTIAL/PLATELET    EKG  EKG Interpretation  Date/Time:  Wednesday May 14 2017 06:17:51 EDT Ventricular Rate:  76 PR Interval:    QRS Duration: 78 QT Interval:  376 QTC Calculation: 423 R Axis:   42 Text Interpretation:  Sinus rhythm Left atrial enlargement Abnormal R-wave progression, early transition Borderline ST elevation, anterolateral leads No prior ecg for comparison Confirmed by Geoffery Lyons ([id number]) on 05/14/2017 6:28:03 AM       Radiology No results found.  Procedures Procedures (including critical care time)  Medications Ordered in ED Medications - No data to display   Initial  Impression / Assessment and Plan / ED Course  I have reviewed the triage vital signs and the nursing notes.  Pertinent labs & imaging results that were available during my care of the patient were reviewed by me and considered in my medical decision making (see chart for details).  Patient presents here with complaints of weaknessand malaise for the past 2 days. I suspect this is related to some sort of viral illness. She does report some congestion. Her workup is unremarkable including CBC, basic, urinalysis, and EKG/troponin. Chest x-ray is clear. She will be discharged with rest and follow-up as needed if not improving.  Final Clinical Impressions(s) / ED Diagnoses   Final diagnoses:  None    New Prescriptions New Prescriptions   No medications on file     Geoffery Lyons, MD 05/14/17 [phone]

## 2017-10-15 DIAGNOSIS — N8110 Cystocele, unspecified: Secondary | ICD-10-CM | POA: Insufficient documentation

## 2017-11-22 ENCOUNTER — Encounter (HOSPITAL_BASED_OUTPATIENT_CLINIC_OR_DEPARTMENT_OTHER): Payer: Self-pay | Admitting: *Deleted

## 2017-11-22 ENCOUNTER — Other Ambulatory Visit: Payer: Self-pay

## 2017-11-22 ENCOUNTER — Emergency Department (HOSPITAL_BASED_OUTPATIENT_CLINIC_OR_DEPARTMENT_OTHER)
Admission: EM | Admit: 2017-11-22 | Discharge: 2017-11-22 | Disposition: A | Discharge status: Home / Self Care | Payer: BLUE CROSS/BLUE SHIELD | Attending: Emergency Medicine | Admitting: Emergency Medicine

## 2017-11-22 DIAGNOSIS — Z79899 Other long term (current) drug therapy: Secondary | ICD-10-CM | POA: Diagnosis not present

## 2017-11-22 DIAGNOSIS — K0889 Other specified disorders of teeth and supporting structures: Secondary | ICD-10-CM | POA: Insufficient documentation

## 2017-11-22 DIAGNOSIS — I10 Essential (primary) hypertension: Secondary | ICD-10-CM | POA: Insufficient documentation

## 2017-11-22 MED ORDER — TRAMADOL HCL 50 MG PO TABS: 50.0000 mg | ORAL_TABLET | Freq: Four times a day (QID) | ORAL | 0 refills | Status: DC | PRN

## 2017-11-22 MED ORDER — PENICILLIN V POTASSIUM 500 MG PO TABS: 500.0000 mg | ORAL_TABLET | Freq: Four times a day (QID) | ORAL | 0 refills | Status: AC

## 2017-11-22 NOTE — ED Notes (Signed)
Patient went to dentist his week and was told that nothing could be found wrong with her teeth.  Her pain increased in the last few days.  Patient states no swelling of mouth or tongue.

## 2017-11-22 NOTE — ED Notes (Signed)
Pt teaching provided on medications that may cause drowsiness. Pt instructed not to drive or operate heavy machinery while taking the prescribed medication. Pt verbalized understanding.   

## 2017-11-22 NOTE — ED Triage Notes (Signed)
Pt states she has had dental pain X 1 week. Went to dentist and was xrayed, but they "didn't give her anything".

## 2017-11-22 NOTE — Discharge Instructions (Signed)
You were seen in the ED with dental pain. We are starting an antibiotic and will have you call your dentist on Monday. Take Tramadol only as needed for severe pain. Do not drive a car while taking this medication as it can affect your ability to drive.   Return to the ED with any new or worsening symptoms.

## 2017-11-22 NOTE — ED Provider Notes (Signed)
Emergency Department Provider Note   I have reviewed the triage vital signs and the nursing notes.   HISTORY  Chief Complaint Dental Pain   HPI Krystal Castillo is a 52 y.o. female with PMH of HTN presented to the emergency department for evaluation of left lower dental pain for the past week.  She saw her dentist earlier this week and had x-rays done.  She states they recommended pulling the tooth but did not schedule an appointment.  She has developed worsening left face swelling.  She was not prescribed an antibiotic at her dental appointment.  She took half of an old hydrocodone for some relief after Tylenol did not improve her symptoms.  She has no difficulty opening her mouth, speaking, breathing, swallowing.  She plans to call her dentist back on Monday for reevaluation and planning.  No radiation of symptoms.  Pain worse with touching the area.   Past Medical History:  Diagnosis Date  . Hypertension     There are no active problems to display for this patient.   Past Surgical History:  Procedure Laterality Date  . CHOLECYSTECTOMY    . FOOT SURGERY    . THYROID SURGERY    . TONSILLECTOMY    . TUBAL LIGATION      Current Outpatient Rx  . Order #: [id number] Class: Historical Med  . Order #: [id number] Class: Historical Med  . Order #: [id number] Class: Historical Med  . Order #: [id number] Class: OTC  . Order #: [id number] Class: Print  . Order #: [id number] Class: Print    Allergies Patient has no known allergies.  History reviewed. No pertinent family history.  Social History Social History   Tobacco Use  . Smoking status: Never Smoker  . Smokeless tobacco: Never Used  Substance Use Topics  . Alcohol use: No  . Drug use: No    Review of Systems  Constitutional: No fever/chills Eyes: No visual changes. ENT: No sore throat. Positive dental pain.  Cardiovascular: Denies chest pain. Respiratory: Denies shortness of breath. Gastrointestinal: No abdominal pain.   No nausea, no vomiting.  No diarrhea.  No constipation. Genitourinary: Negative for dysuria. Musculoskeletal: Negative for back pain. Skin: Negative for rash. Neurological: Negative for headaches, focal weakness or numbness.  10-point ROS otherwise negative.  ____________________________________________   PHYSICAL EXAM:  VITAL SIGNS: ED Triage Vitals  Enc Vitals Group     BP 11/22/17 1946 (!) 171/96     Pulse Rate 11/22/17 1946 81     Resp 11/22/17 1946 20     Temp 11/22/17 1946 98.3 F (36.8 C)     Temp Source 11/22/17 1946 Oral     SpO2 11/22/17 1946 100 %     Weight 11/22/17 1947 216 lb 12.8 oz (98.3 kg)     Height 11/22/17 1947  (1.702 m)     Pain Score 11/22/17 1944 9   Constitutional: Alert and oriented. Well appearing and in no acute distress. Eyes: Conjunctivae are normal.  Head: Atraumatic. Nose: No congestion/rhinnorhea. Mouth/Throat: Mucous membranes are moist. No visible dental abscess. Some mild cheek swelling on the left. Soft submandibular compartment. No trismus.  Neck: No stridor. Cardiovascular: Normal rate, regular rhythm. Good peripheral circulation. Grossly normal heart sounds.   Respiratory: Normal respiratory effort.  No retractions. Lungs CTAB. Gastrointestinal: No distention.  Musculoskeletal:No gross deformities of extremities. Neurologic:   No gross focal neurologic deficits are appreciated.  Skin:  Skin is warm, dry and intact. No rash noted.  ____________________________________________   PROCEDURES  Procedure(s)  performed:   Procedures  None ____________________________________________   INITIAL IMPRESSION / ASSESSMENT AND PLAN / ED COURSE  Pertinent labs & imaging results that were available during my care of the patient were reviewed by me and considered in my medical decision making (see chart for details).  Patient presents to the emergency department for evaluation of left-sided dental pain for the past week.  She has  mild left cheek swelling but no signs of impending airway compromise.  No fevers.  Suspect a developing periapical abscess.  Plan to start penicillin provide a small amount of tramadol to supplement her over-the-counter pain medications for breakthrough pain.  She has dental care established and plans to call back on Monday to schedule an urgent appointment.  At this time, I do not feel there is any life-threatening condition present. I have reviewed and discussed all exam findings with patient. I have reviewed nursing notes and appropriate previous records.  I feel the patient is safe to be discharged home without further emergent workup. Discussed usual and customary return precautions. Patient and family (if present) verbalize understanding and are comfortable with this plan.  Patient will follow-up with their primary care provider. If they do not have a primary care provider, information for follow-up has been provided to them. All questions have been answered.  ____________________________________________  FINAL CLINICAL IMPRESSION(S) / ED DIAGNOSES  Final diagnoses:  Pain, dental    NEW OUTPATIENT MEDICATIONS STARTED DURING THIS VISIT:  New Prescriptions   PENICILLIN V POTASSIUM (VEETID) 500 MG TABLET    Take 1 tablet (500 mg total) by mouth 4 (four) times daily for 7 days.   TRAMADOL (ULTRAM) 50 MG TABLET    Take 1 tablet (50 mg total) by mouth every 6 (six) hours as needed.    Note:  This document was prepared using Dragon voice recognition software and may include unintentional dictation errors.  Alona Bene, MD Emergency Medicine    Long, Arlyss Repress, MD 11/22/17 2010

## 2018-02-09 DIAGNOSIS — E1165 Type 2 diabetes mellitus with hyperglycemia: Secondary | ICD-10-CM | POA: Insufficient documentation

## 2018-04-04 ENCOUNTER — Other Ambulatory Visit: Payer: Self-pay

## 2018-04-04 ENCOUNTER — Encounter (HOSPITAL_BASED_OUTPATIENT_CLINIC_OR_DEPARTMENT_OTHER): Payer: Self-pay | Admitting: *Deleted

## 2018-04-04 DIAGNOSIS — K029 Dental caries, unspecified: Secondary | ICD-10-CM | POA: Insufficient documentation

## 2018-04-04 DIAGNOSIS — Z79899 Other long term (current) drug therapy: Secondary | ICD-10-CM | POA: Insufficient documentation

## 2018-04-04 DIAGNOSIS — I10 Essential (primary) hypertension: Secondary | ICD-10-CM | POA: Insufficient documentation

## 2018-04-04 NOTE — ED Triage Notes (Signed)
Pt reports she broke right upper tooth today and now is having pain

## 2018-04-05 ENCOUNTER — Emergency Department (HOSPITAL_BASED_OUTPATIENT_CLINIC_OR_DEPARTMENT_OTHER)
Admission: EM | Admit: 2018-04-05 | Discharge: 2018-04-05 | Disposition: A | Discharge status: Home / Self Care | Payer: BLUE CROSS/BLUE SHIELD | Attending: Emergency Medicine | Admitting: Emergency Medicine

## 2018-04-05 DIAGNOSIS — I10 Essential (primary) hypertension: Secondary | ICD-10-CM

## 2018-04-05 DIAGNOSIS — K029 Dental caries, unspecified: Secondary | ICD-10-CM

## 2018-04-05 MED ORDER — AMOXICILLIN 500 MG PO CAPS
1000.0000 mg | ORAL_CAPSULE | Freq: Once | ORAL | Status: AC
  Administered 2018-04-05: 1000 mg via ORAL
  Filled 2018-04-05: qty 2

## 2018-04-05 MED ORDER — PENICILLIN V POTASSIUM 500 MG PO TABS: 500.0000 mg | ORAL_TABLET | Freq: Four times a day (QID) | ORAL | 0 refills | Status: DC

## 2018-04-05 MED ORDER — TRAMADOL HCL 50 MG PO TABS: 50.0000 mg | ORAL_TABLET | Freq: Four times a day (QID) | ORAL | 0 refills | Status: DC | PRN

## 2018-04-05 NOTE — ED Notes (Signed)
ED Provider at bedside. 

## 2018-04-05 NOTE — ED Provider Notes (Signed)
[facility] EMERGENCY DEPARTMENT Provider Note   CSN: [id number] Arrival date & time: 04/04/18  2319     History   Chief Complaint Chief Complaint  Patient presents with  . Dental Pain    HPI Krystal Castillo is a 52 y.o. female.  The history is provided by the patient.  Dental Pain    She has a history of hypertension and complains of pain in the right upper molar for the last 5 days.  She has been taking ibuprofen for pain with slight relief.  Nothing else makes the pain better or worse.  She states that she had seen a dentist in the last several months for an extraction, but had not made an appointment regarding this tooth.  Past Medical History:  Diagnosis Date  . Hypertension     There are no active problems to display for this patient.   Past Surgical History:  Procedure Laterality Date  . CHOLECYSTECTOMY    . FOOT SURGERY    . THYROID SURGERY    . TONSILLECTOMY    . TUBAL LIGATION       OB History   None      Home Medications    Prior to Admission medications   Medication Sig Start Date End Date Taking? Authorizing Provider  atenolol (TENORMIN) 100 MG tablet Take by mouth daily.     Yes [provider]  hydrochlorothiazide (MICROZIDE) 12.5 MG capsule Take 12.5 mg by mouth daily.   Yes [provider]  amoxicillin (AMOXIL) 875 MG tablet Take 875 mg by mouth 2 (two) times daily.    [provider]  neomycin-polymyxin-pramoxine (NEOSPORIN PLUS) 1 % cream Apply topically 2 (two) times daily. Apply to foot twice daily. 12/17/15   Molpus, John, MD  traMADol (ULTRAM) 50 MG tablet Take 1 tablet (50 mg total) by mouth every 6 (six) hours as needed. 11/22/17   Long, Arlyss Repress, MD    Family History No family history on file.  Social History Social History   Tobacco Use  . Smoking status: Never Smoker  . Smokeless tobacco: Never Used  Substance Use Topics  . Alcohol use: No  . Drug use: No     Allergies   Patient has  no known allergies.   Review of Systems Review of Systems  All other systems reviewed and are negative.    Physical Exam Updated Vital Signs BP (!) 183/88 (BP Location: Left Arm)   Pulse 72   Temp 98.4 F (36.9 C) (Oral)   Resp 18   Ht 5\' 6"  (1.676 m)   Wt 99.1 kg   SpO2 99%   BMI 35.26 kg/m   Physical Exam  Nursing note and vitals reviewed.  52 year old female, resting comfortably and in no acute distress. Vital signs are significant for elevated systolic blood pressure. Oxygen saturation is 99%, which is normal. Head is normocephalic and atraumatic. PERRLA, EOMI. Oropharynx is clear.  Tooth #2 is rotted down to the gingival line and is tender to percussion.  There is no gingival swelling and there is no drainage. Neck is nontender and supple without adenopathy or JVD. Back is nontender and there is no CVA tenderness. Lungs are clear without rales, wheezes, or rhonchi. Chest is nontender. Heart has regular rate and rhythm without murmur. Abdomen is soft, flat, nontender without masses or hepatosplenomegaly and peristalsis is normoactive. Extremities have no cyanosis or edema, full range of motion is present. Skin is warm and dry without rash.  Neurologic: Mental status is normal, cranial nerves are intact, there are no motor or sensory deficits.  ED Treatments / Results   Procedures Procedures   Medications Ordered in ED Medications  amoxicillin (AMOXIL) capsule 1,000 mg (has no administration in time range)     Initial Impression / Assessment and Plan / ED Course  I have reviewed the triage vital signs and the nursing notes.  Dental caries involving tooth #2.  She is discharged with prescriptions for penicillin V potassium and tramadol, referred to her dentist for follow-up.  Old records are reviewed, and she has several prior ED visits for dental pain.  Her record on the [place] controlled substance reporting website was reviewed, and she has only 2  narcotic prescriptions in the last 2 years.  Final Clinical Impressions(s) / ED Diagnoses   Final diagnoses:  Pain due to dental caries  Elevated blood pressure reading with diagnosis of hypertension    ED Discharge Orders         Ordered    penicillin v potassium (VEETID) 500 MG tablet  4 times daily     04/05/18 0244    traMADol (ULTRAM) 50 MG tablet  Every 6 hours PRN     04/05/18 0244           Dione Booze, MD 04/05/18 [phone]

## 2018-04-05 NOTE — ED Notes (Signed)
Pt understood dc material. NAD noted. Scripts given at [facility]. Sign pad not working in room

## 2018-04-05 NOTE — Discharge Instructions (Addendum)
You will need to see a dentist to have this tooth taken care of.

## 2019-04-16 ENCOUNTER — Emergency Department (HOSPITAL_BASED_OUTPATIENT_CLINIC_OR_DEPARTMENT_OTHER): Payer: BLUE CROSS/BLUE SHIELD

## 2019-04-16 ENCOUNTER — Other Ambulatory Visit: Payer: Self-pay

## 2019-04-16 ENCOUNTER — Emergency Department (HOSPITAL_BASED_OUTPATIENT_CLINIC_OR_DEPARTMENT_OTHER)
Admission: EM | Admit: 2019-04-16 | Discharge: 2019-04-16 | Disposition: A | Discharge status: Home / Self Care | Payer: BLUE CROSS/BLUE SHIELD | Attending: Emergency Medicine | Admitting: Emergency Medicine

## 2019-04-16 ENCOUNTER — Encounter (HOSPITAL_BASED_OUTPATIENT_CLINIC_OR_DEPARTMENT_OTHER): Payer: Self-pay | Admitting: Emergency Medicine

## 2019-04-16 DIAGNOSIS — Z79899 Other long term (current) drug therapy: Secondary | ICD-10-CM | POA: Diagnosis not present

## 2019-04-16 DIAGNOSIS — I10 Essential (primary) hypertension: Secondary | ICD-10-CM | POA: Diagnosis not present

## 2019-04-16 DIAGNOSIS — R739 Hyperglycemia, unspecified: Secondary | ICD-10-CM | POA: Insufficient documentation

## 2019-04-16 DIAGNOSIS — M79604 Pain in right leg: Secondary | ICD-10-CM | POA: Insufficient documentation

## 2019-04-16 LAB — BASIC METABOLIC PANEL
Anion gap: 10 (ref 5–15)
BUN: 13 mg/dL (ref 6–20)
CO2: 26 mmol/L (ref 22–32)
Calcium: 9.4 mg/dL (ref 8.9–10.3)
Chloride: 101 mmol/L (ref 98–111)
Creatinine, Ser: 0.79 mg/dL (ref 0.44–1.00)
GFR calc Af Amer: >60 mL/min (ref >60)
GFR calc non Af Amer: >60 mL/min (ref >60)
Glucose, Bld: 238 mg/dL — ABNORMAL HIGH (ref 70–99)
Potassium: 3.6 mmol/L (ref 3.5–5.1)
Sodium: 137 mmol/L (ref 135–145)

## 2019-04-16 MED ORDER — METHOCARBAMOL 500 MG PO TABS: 500.0000 mg | ORAL_TABLET | Freq: Four times a day (QID) | ORAL | 0 refills | Status: DC

## 2019-04-16 NOTE — ED Triage Notes (Signed)
Right leg pain for 2 days. No injury. Pt ambulatory.

## 2019-04-16 NOTE — ED Provider Notes (Signed)
[facility] EMERGENCY DEPARTMENT Provider Note   CSN: [id number] Arrival date & time: 04/16/19  1758     History   Chief Complaint Chief Complaint  Patient presents with  . Leg Pain    HPI Krystal Castillo is a 53 y.o. female.     Patient with history of hypertension presents the emergency department with complaint of right leg pain.  Pain is intermittent.  Starts in her right calf and moves up towards her right thigh.  She describes it as an aching type pain.  No injuries.  Patient is on her feet a lot while at work.  She denies any numbness or tingling.  No back pain or history of slipped disks.  Patient denies risk factors for DVT including: unilateral leg swelling, history of DVT/PE/other blood clots, use of exogenous hormones, recent immobilizations, recent surgery, recent travel (>4hr segment), malignancy. She takes HCTZ as needed for 'fluid build-up'.       Past Medical History:  Diagnosis Date  . Hypertension     There are no active problems to display for this patient.   Past Surgical History:  Procedure Laterality Date  . CHOLECYSTECTOMY    . FOOT SURGERY    . THYROID SURGERY    . TONSILLECTOMY    . TUBAL LIGATION       OB History   No obstetric history on file.      Home Medications    Prior to Admission medications   Medication Sig Start Date End Date Taking? Authorizing Provider  amoxicillin-clavulanate (AUGMENTIN) 875-125 MG tablet Take by mouth. 04/15/19 04/25/19 Yes [provider]  atenolol (TENORMIN) 100 MG tablet Take by mouth daily.      [provider]  hydrochlorothiazide (MICROZIDE) 12.5 MG capsule Take 12.5 mg by mouth daily.    [provider]  penicillin v potassium (VEETID) 500 MG tablet Take 1 tablet (500 mg total) by mouth 4 (four) times daily. 10/28/94   Delora Fuel, MD  traMADol (ULTRAM) 50 MG tablet Take 1 tablet (50 mg total) by mouth every 6 (six) hours as needed. 08/31/27   Delora Fuel, MD     Family History No family history on file.  Social History Social History   Tobacco Use  . Smoking status: Never Smoker  . Smokeless tobacco: Never Used  Substance Use Topics  . Alcohol use: No  . Drug use: No     Allergies   Cefdinir and Doxycycline   Review of Systems Review of Systems  Constitutional: Negative for fever.  Respiratory: Negative for shortness of breath.   Gastrointestinal: Negative for nausea and vomiting.  Musculoskeletal: Positive for myalgias. Negative for back pain.     Physical Exam Updated Vital Signs BP (!) 183/95 (BP Location: Left Arm)   Pulse 84   Temp 98.2 F (36.8 C) (Oral)   Resp 20   Ht 5\' 6"  (1.676 m)   Wt 99.8 kg   SpO2 99%   BMI 35.51 kg/m   Physical Exam Vitals signs and nursing note reviewed.  Constitutional:      Appearance: She is well-developed.  HENT:     Head: Normocephalic and atraumatic.  Eyes:     Pupils: Pupils are equal, round, and reactive to light.  Neck:     Musculoskeletal: Normal range of motion and neck supple.  Cardiovascular:     Rate and Rhythm: Normal rate and regular rhythm.     Pulses: Normal pulses. No decreased pulses.  Dorsalis pedis pulses are 2+ on the right side and 2+ on the left side.  Musculoskeletal:        General: No tenderness.     Comments: No focal tenderness or edema in R LE.   Skin:    General: Skin is warm and dry.  Neurological:     Mental Status: She is alert.     Sensory: No sensory deficit.     Comments: Motor, sensation, and vascular distal to the injury is fully intact.       ED Treatments / Results  Labs (all labs ordered are listed, but only abnormal results are displayed) Labs Reviewed  BASIC METABOLIC PANEL - Abnormal; Notable for the following components:      Result Value   Glucose, Bld 238 (*)    All other components within normal limits    EKG None  Radiology [place] Venous Img Lower Right (dvt Study)  Result Date: 04/16/2019 CLINICAL DATA:   Leg pain EXAM: RIGHT LOWER EXTREMITY VENOUS DOPPLER ULTRASOUND TECHNIQUE: Gray-scale sonography with graded compression, as well as color Doppler and duplex ultrasound were performed to evaluate the lower extremity deep venous systems from the level of the common femoral vein and including the common femoral, femoral, profunda femoral, popliteal and calf veins including the posterior tibial, peroneal and gastrocnemius veins when visible. The superficial great saphenous vein was also interrogated. Spectral Doppler was utilized to evaluate flow at rest and with distal augmentation maneuvers in the common femoral, femoral and popliteal veins. COMPARISON:  09/17/2014 FINDINGS: Contralateral Common Femoral Vein: Respiratory phasicity is normal and symmetric with the symptomatic side. No evidence of thrombus. Normal compressibility. Common Femoral Vein: No evidence of thrombus. Normal compressibility, respiratory phasicity and response to augmentation. Saphenofemoral Junction: No evidence of thrombus. Normal compressibility and flow on color Doppler imaging. Profunda Femoral Vein: No evidence of thrombus. Normal compressibility and flow on color Doppler imaging. Femoral Vein: No evidence of thrombus. Normal compressibility, respiratory phasicity and response to augmentation. Evaluation of the femoral artery is limited. Popliteal Vein: No evidence of thrombus. Normal compressibility, respiratory phasicity and response to augmentation. Calf Veins: No evidence of thrombus. Normal compressibility and flow on color Doppler imaging. Superficial Great Saphenous Vein: No evidence of thrombus. Normal compressibility. Venous Reflux:  None. Other Findings:  None. IMPRESSION: No evidence of deep venous thrombosis. Electronically Signed   By: Katherine Mantlehristopher  Green M.D.   On: 04/16/2019 21:10    Procedures Procedures (including critical care time)  Medications Ordered in ED Medications - No data to display   Initial Impression /  Assessment and Plan / ED Course  I have reviewed the triage vital signs and the nursing notes.  Pertinent labs & imaging results that were available during my care of the patient were reviewed by me and considered in my medical decision making (see chart for details).        Patient seen and examined.  Exam is reassuring.  Will rule out DVT with ultrasound and check BMP given the patient's occasional use of HCTZ to ensure no electrolyte abnormality.  Vital signs reviewed and are as follows: BP (!) 183/95 (BP Location: Left Arm)   Pulse 84   Temp 98.2 F (36.8 C) (Oral)   Resp 20   Ht 5\' 6"  (1.676 m)   Wt 99.8 kg   SpO2 99%   BMI 35.51 kg/m   9:39 PM DVT study is negative.  Patient updated.  Normal potassium and other electrolytes.  Blood sugar is  elevated without signs of DKA.  Patient advised to rest if able, stretch, use muscle relaxer medication and Tylenol for pain.  Encouraged PCP follow-up as needed.  She is already on Augmentin for a presumed sinus infection per her PCP.  Encouraged return with worsening pain, redness, swelling, fevers, new symptoms or other concerns.  Patient verbalizes understanding and agrees with plan.  Encourage PCP follow-up if not improved in the next several days.  Patient counseled on use of narcotic pain medications. Counseled not to combine these medications with others containing tylenol. Urged not to drink alcohol, drive, or perform any other activities that requires focus while taking these medications. The patient verbalizes understanding and agrees with the plan.   Final Clinical Impressions(s) / ED Diagnoses   Final diagnoses:  Right leg pain  Hyperglycemia without ketosis   Patient with right leg pain, no significant swelling.  No injuries.  I doubt fracture.  DVT study tonight was negative.  Normal electrolytes and doubt muscle cramps.  Suspect musculoskeletal pain, possible overuse injury as patient is on her feet a lot.  She does not  really have features consistent with radiculopathy however this is lower on the differential.  No numbness, weakness or back pain.  Patient appears well and follow-up with her doctor, return instructions as above.  ED Discharge Orders         Ordered    methocarbamol (ROBAXIN) 500 MG tablet  4 times daily     04/16/19 2131           Renne Crigler, Cordelia Poche 04/16/19 2141    Tilden Fossa, MD 04/17/19 1258

## 2019-04-16 NOTE — ED Notes (Signed)
Pt teaching provided on medications that may cause drowsiness. Pt instructed not to drive or operate heavy machinery while taking the prescribed medication. Pt verbalized understanding.   

## 2019-04-16 NOTE — Discharge Instructions (Signed)
Please read and follow all provided instructions.  Your diagnoses today include:  1. Right leg pain   2. Hyperglycemia without ketosis     Tests performed today include:  Electrolytes -normal potassium, high blood sugar  Ultrasound of your right leg -no blood clots  Vital signs. See below for your results today.   Medications prescribed:   Robaxin (methocarbamol) - muscle relaxer medication  DO NOT drive or perform any activities that require you to be awake and alert because this medicine can make you drowsy.   Take any prescribed medications only as directed.  Home care instructions:  Follow any educational materials contained in this packet.  BE VERY CAREFUL not to take multiple medicines containing Tylenol (also called acetaminophen). Doing so can lead to an overdose which can damage your liver and cause liver failure and possibly death.   Follow-up instructions: Please follow-up with your primary care provider in the next 5 days for further evaluation of your symptoms.   Return instructions:   Please return to the Emergency Department if you experience worsening symptoms.   Please return if you have any other emergent concerns.  Additional Information:  Your vital signs today were: BP (!) 183/95 (BP Location: Left Arm)    Pulse 84    Temp 98.2 F (36.8 C) (Oral)    Resp 20    Ht 5\' 6"  (1.676 m)    Wt 99.8 kg    SpO2 99%    BMI 35.51 kg/m  If your blood pressure (BP) was elevated above 135/85 this visit, please have this repeated by your doctor within one month. --------------

## 2019-06-01 ENCOUNTER — Emergency Department (HOSPITAL_BASED_OUTPATIENT_CLINIC_OR_DEPARTMENT_OTHER): Payer: BLUE CROSS/BLUE SHIELD

## 2019-06-01 ENCOUNTER — Other Ambulatory Visit: Payer: Self-pay

## 2019-06-01 ENCOUNTER — Encounter (HOSPITAL_BASED_OUTPATIENT_CLINIC_OR_DEPARTMENT_OTHER): Payer: Self-pay | Admitting: *Deleted

## 2019-06-01 ENCOUNTER — Emergency Department (HOSPITAL_BASED_OUTPATIENT_CLINIC_OR_DEPARTMENT_OTHER)
Admission: EM | Admit: 2019-06-01 | Discharge: 2019-06-01 | Disposition: A | Discharge status: Home / Self Care | Payer: BLUE CROSS/BLUE SHIELD | Attending: Emergency Medicine | Admitting: Emergency Medicine

## 2019-06-01 DIAGNOSIS — I10 Essential (primary) hypertension: Secondary | ICD-10-CM | POA: Diagnosis not present

## 2019-06-01 DIAGNOSIS — Z881 Allergy status to other antibiotic agents status: Secondary | ICD-10-CM | POA: Diagnosis not present

## 2019-06-01 DIAGNOSIS — M79604 Pain in right leg: Secondary | ICD-10-CM | POA: Diagnosis present

## 2019-06-01 DIAGNOSIS — Z79899 Other long term (current) drug therapy: Secondary | ICD-10-CM | POA: Diagnosis not present

## 2019-06-01 MED ORDER — PREDNISONE 50 MG PO TABS
60.0000 mg | ORAL_TABLET | Freq: Once | ORAL | Status: AC
  Administered 2019-06-01: 60 mg via ORAL
  Filled 2019-06-01: qty 1

## 2019-06-01 MED ORDER — PREDNISONE 10 MG PO TABS: 40.0000 mg | ORAL_TABLET | Freq: Every day | ORAL | 0 refills | Status: AC

## 2019-06-01 MED ORDER — KETOROLAC TROMETHAMINE 15 MG/ML IJ SOLN
15.0000 mg | Freq: Once | INTRAMUSCULAR | Status: AC
  Administered 2019-06-01: 15 mg via INTRAVENOUS
  Filled 2019-06-01: qty 1

## 2019-06-01 NOTE — Discharge Instructions (Signed)
You should follow up with your primary care provider for further treatment options such as physical therapy or referral to specialist. Thank you for allowing me to care for you today. Please return to the emergency department if you have new or worsening symptoms. Take your medications as instructed.

## 2019-06-01 NOTE — ED Triage Notes (Signed)
Right leg pain x 2 weeks. A month ago she was evaluated for a DVT due to leg pain.

## 2019-06-01 NOTE — ED Provider Notes (Signed)
[facility] EMERGENCY DEPARTMENT Provider Note   CSN: [id number] Arrival date & time: 06/01/19  2052     History   Chief Complaint Chief Complaint  Patient presents with  . Leg Pain    HPI Krystal Castillo is a 53 y.o. female.     Patient is a 53 year old female with past medical history of hypertension presented to the emergency department for right leg pain.  Reports that this has been going on for 1 to 2 months.  She was seen in the emergency department about 1 month ago for the same and had a negative DVT study.  Reports that the pain is worsening and the prescription for Robaxin that she was given was not helping.  She did see a chiropractor and was told that she may have a pinched nerve in her leg.  She denies any numbness, tingling, weakness, saddle anesthesia, fever, loss of control of bladder or bowel.  She reports that the pain is worse when she was up and walking and she has to sit for long periods of time at work and is interfering with work as she was trying to work overtime today with they would not let her because she needed to rest so much.  Reports that she is also not able to lay on that side.     Past Medical History:  Diagnosis Date  . Hypertension     There are no active problems to display for this patient.   Past Surgical History:  Procedure Laterality Date  . CHOLECYSTECTOMY    . FOOT SURGERY    . THYROID SURGERY    . TONSILLECTOMY    . TUBAL LIGATION       OB History   No obstetric history on file.      Home Medications    Prior to Admission medications   Medication Sig Start Date End Date Taking? Authorizing Provider  atenolol (TENORMIN) 100 MG tablet Take by mouth daily.     Yes [provider]  hydrochlorothiazide (MICROZIDE) 12.5 MG capsule Take 12.5 mg by mouth daily.   Yes [provider]  methocarbamol (ROBAXIN) 500 MG tablet Take 1 tablet (500 mg total) by mouth 4 (four) times daily. 04/16/19  Yes Renne Crigler, PA-C  penicillin v potassium (VEETID) 500 MG tablet Take 1 tablet (500 mg total) by mouth 4 (four) times daily. 04/05/18   Dione Booze, MD  predniSONE (DELTASONE) 10 MG tablet Take 4 tablets (40 mg total) by mouth daily for 5 days. 06/01/19 06/06/19  Arlyn Dunning, PA-C  traMADol (ULTRAM) 50 MG tablet Take 1 tablet (50 mg total) by mouth every 6 (six) hours as needed. 04/05/18   Dione Booze, MD    Family History No family history on file.  Social History Social History   Tobacco Use  . Smoking status: Never Smoker  . Smokeless tobacco: Never Used  Substance Use Topics  . Alcohol use: No  . Drug use: No     Allergies   Cefdinir and Doxycycline   Review of Systems Review of Systems  Constitutional: Negative for chills and fever.  Respiratory: Negative for cough and shortness of breath.   Cardiovascular: Negative for chest pain, palpitations and leg swelling.  Gastrointestinal: Negative for abdominal pain, diarrhea, nausea and vomiting.  Genitourinary: Negative for dysuria.  Musculoskeletal: Positive for arthralgias and back pain. Negative for gait problem, joint swelling, myalgias, neck pain and neck stiffness.     Physical Exam Updated Vital Signs  BP (!) 182/91   Pulse 77   Temp 97.6 F (36.4 C) (Oral)   Resp 16   Ht 5\' 6"  (1.676 m)   Wt 97.6 kg   SpO2 98%   BMI 34.73 kg/m   Physical Exam Vitals signs and nursing note reviewed.  Constitutional:      General: She is not in acute distress.    Appearance: Normal appearance. She is obese. She is not ill-appearing, toxic-appearing or diaphoretic.  HENT:     Head: Normocephalic.     Mouth/Throat:     Mouth: Mucous membranes are moist.  Eyes:     Conjunctiva/sclera: Conjunctivae normal.  Pulmonary:     Effort: Pulmonary effort is normal.  Musculoskeletal:     Right knee: Normal.     Right ankle: Normal.     Comments: Patient has lateral right hip tenderness to palpation with increased pain with external  and internal rotation.  She has normal pulses, strength, sensation and deep tendon reflexes bilaterally.  She has no swelling in the leg.  No skin changes.  Skin:    General: Skin is dry.     Capillary Refill: Capillary refill takes less than 2 seconds.  Neurological:     Mental Status: She is alert.     Sensory: No sensory deficit.     Motor: No weakness.     Gait: Gait normal.  Psychiatric:        Mood and Affect: Mood normal.      ED Treatments / Results  Labs (all labs ordered are listed, but only abnormal results are displayed) Labs Reviewed - No data to display  EKG None  Radiology Dg Hip Unilat W Or Wo Pelvis 2-3 Views Right  Result Date: 06/01/2019 CLINICAL DATA:  Right leg pain for 2 weeks, history of right hip pain for 8 years, no known injury EXAM: DG HIP (WITH OR WITHOUT PELVIS) 2-3V RIGHT COMPARISON:  Radiograph 05/25/2019 FINDINGS: No acute fracture or traumatic malalignment. Bones of the pelvis remain congruent. Femoral heads remain normally located. Mild degenerative changes noted at the SI joints and both hips. Enthesopathic changes noted on the iliac crests. Bowel gas pattern is nonobstructive. Remaining soft tissues are unremarkable IMPRESSION: No acute osseous abnormality. 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 Electronically Signed   By: Kreg ShropshirePrice  DeHay M.D.   On: 06/01/2019 21:36    Procedures Procedures (including critical care time)  Medications Ordered in ED Medications  ketorolac (TORADOL) 15 MG/ML injection 15 mg (15 mg Intravenous Given 06/01/19 2132)  predniSONE (DELTASONE) tablet 60 mg (60 mg Oral Given 06/01/19 2131)     Initial Impression / Assessment and Plan / ED Course  I have reviewed the triage vital signs and the nursing notes.  Pertinent labs & imaging results that were available during my care of the patient were reviewed by me and considered in my medical decision making (see chart for details).  Clinical Course as of May 31 2140  Tue Jun 01, 2019  2115 Patient with worsening right leg pain for 1 to 2 months.  Had a negative DVT study.  I think that this is likely a hip arthralgia versus lumbar radicular pain.  Patient is seeing chiropractor and reports she has already had imaging done by chiropractor and was told she might have a pinched nerve.  I will obtain x-rays of the right hip.  I do not have any concern for cauda equina syndrome, infection.   [KM]  2140 Workup showing mild degenerative changes in the hips. Will start her on prednisone and have her f/u with chiropractor and ortho   [KM]    Clinical Course User Index [KM] Alveria Apley, PA-C       Based on review of vitals, medical screening exam, lab work and/or imaging, there does not appear to be an acute, emergent etiology for the patient's symptoms. Counseled pt on good return precautions and encouraged both PCP and ED follow-up as needed.  Prior to discharge, I also discussed incidental imaging findings with patient in detail and advised appropriate, recommended follow-up in detail.  Clinical Impression: 1. Right leg pain     Disposition: Discharge  Prior to providing a prescription for a controlled substance, I independently reviewed the patient's recent prescription history on the [place]. The patient had no recent or regular prescriptions and was deemed appropriate for a brief, less than 3 day prescription of narcotic for acute analgesia.  This note was prepared with assistance of Systems analyst. Occasional wrong-word or sound-a-like substitutions may have occurred due to the inherent limitations of voice recognition software.   Final Clinical Impressions(s) / ED Diagnoses   Final diagnoses:  Right leg pain    ED Discharge Orders         Ordered    predniSONE (DELTASONE) 10 MG tablet  Daily     06/01/19 2141           Alveria Apley, PA-C 06/01/19 2142     Lennice Sites, DO 06/01/19 2203

## 2019-06-01 NOTE — ED Notes (Signed)
ED Provider at bedside. 

## 2019-06-01 NOTE — ED Notes (Signed)
Patient transported to X-ray 

## 2019-06-19 ENCOUNTER — Emergency Department (HOSPITAL_BASED_OUTPATIENT_CLINIC_OR_DEPARTMENT_OTHER): Payer: BLUE CROSS/BLUE SHIELD

## 2019-06-19 ENCOUNTER — Emergency Department (HOSPITAL_BASED_OUTPATIENT_CLINIC_OR_DEPARTMENT_OTHER)
Admission: EM | Admit: 2019-06-19 | Discharge: 2019-06-19 | Disposition: A | Discharge status: Home / Self Care | Payer: BLUE CROSS/BLUE SHIELD | Attending: Emergency Medicine | Admitting: Emergency Medicine

## 2019-06-19 ENCOUNTER — Other Ambulatory Visit: Payer: Self-pay

## 2019-06-19 ENCOUNTER — Encounter (HOSPITAL_BASED_OUTPATIENT_CLINIC_OR_DEPARTMENT_OTHER): Payer: Self-pay | Admitting: Emergency Medicine

## 2019-06-19 DIAGNOSIS — I10 Essential (primary) hypertension: Secondary | ICD-10-CM | POA: Insufficient documentation

## 2019-06-19 DIAGNOSIS — M79604 Pain in right leg: Secondary | ICD-10-CM | POA: Diagnosis not present

## 2019-06-19 DIAGNOSIS — Z881 Allergy status to other antibiotic agents status: Secondary | ICD-10-CM | POA: Diagnosis not present

## 2019-06-19 DIAGNOSIS — Z79899 Other long term (current) drug therapy: Secondary | ICD-10-CM | POA: Diagnosis not present

## 2019-06-19 NOTE — ED Triage Notes (Signed)
Patient states that she is here for the same problem as before. Patient states that her right lower leg still hurts. She reports that she wants to know what is wrong and she is sick of medicine

## 2019-06-19 NOTE — ED Notes (Signed)
ED Provider at bedside. 

## 2019-06-19 NOTE — Discharge Instructions (Signed)
Please make an appointment with Dr. Carlis Abbott with vascular surgery Return if you are worsening

## 2019-06-19 NOTE — ED Provider Notes (Signed)
[facility] EMERGENCY DEPARTMENT Provider Note   CSN: [id number] Arrival date & time: 06/19/19  1732     History   Chief Complaint Chief Complaint  Patient presents with   Leg Pain    HPI Krystal Castillo is a 53 y.o. female with history of hypertension who presents with right leg pain.  Patient states that the pain has been ongoing for several months.  She has been seen by multiple providers for this problem.  She states that the pain is in her right lower leg and starts around her ankle and shoots up to her right knee.  It is primarily on the lateral aspect of the leg.  Pain is constant and worse when she walks and worse when she is lying flat.  She has been taking over-the-counter medications for pain and it will temporarily help but then the pain will come back.  She denies any back pain, right hip pain, leg weakness.  She does have some paresthesias in the foot at times.  She has had a negative DVT study several months ago and an x-ray of her hip and low back which shows mild degenerative changes.  She is requesting another DVT study today because she is very worried about a blood clot.  She states that he is having difficulty doing her job due to the pain.  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     HPI  Past Medical History:  Diagnosis Date   Hypertension     There are no active problems to display for this patient.   Past Surgical History:  Procedure Laterality Date   CHOLECYSTECTOMY     FOOT SURGERY     THYROID SURGERY     TONSILLECTOMY     TUBAL LIGATION       OB History   No obstetric history on file.      Home Medications    Prior to Admission medications   Medication Sig Start Date End Date Taking? Authorizing Provider  atenolol (TENORMIN) 100 MG tablet Take by mouth daily.      [provider]  hydrochlorothiazide (MICROZIDE) 12.5 MG capsule Take 12.5 mg by mouth daily.    [provider]  methocarbamol (ROBAXIN) 500 MG tablet Take 1 tablet (500 mg total) by mouth 4 (four) times daily. 04/16/19   Carlisle Cater, PA-C  penicillin v potassium (VEETID) 500 MG tablet Take 1 tablet (500 mg total) by mouth 4 (four) times daily. 4/0/98   Delora Fuel, MD  traMADol (ULTRAM) 50 MG tablet Take 1 tablet (50 mg total) by mouth every 6 (six) hours as needed. 07/29/89   Delora Fuel, MD    Family History History reviewed. No pertinent family history.  Social History Social History   Tobacco Use   Smoking status: Never Smoker   Smokeless tobacco: Never Used  Substance Use Topics   Alcohol use: No   Drug use: No     Allergies   Cefdinir and Doxycycline   Review of Systems Review of Systems  Musculoskeletal: Positive for myalgias. Negative for arthralgias and back pain.  Skin: Negative for wound.  Neurological: Positive for numbness. Negative for weakness.     Physical Exam Updated Vital Signs BP (!) 165/82 (BP Location: Left Arm)    Pulse 77    Temp 99 F (37.2 C) (Oral)    Resp 18    Ht 5\' 6"  (1.676 m)    Wt 97.6  kg    SpO2 100%    BMI 34.73 kg/m   Physical Exam Vitals signs and nursing note reviewed.  Constitutional:      General: She is not in acute distress.    Appearance: Normal appearance. She is well-developed. She is not ill-appearing.  HENT:     Head: Normocephalic and atraumatic.  Eyes:     General: No scleral icterus.       Right eye: No discharge.        Left eye: No discharge.     Conjunctiva/sclera: Conjunctivae normal.     Pupils: Pupils are equal, round, and reactive to light.  Neck:     Musculoskeletal: Normal range of motion.  Cardiovascular:     Rate and Rhythm: Normal rate.  Pulmonary:     Effort: Pulmonary effort is normal. No respiratory distress.  Abdominal:     General: There is no distension.  Musculoskeletal:     Comments: No low back tenderness  RLE: No obvious swelling, deformity, or warmth. No significant  tenderness. Pt indicates that her pain is over the lateral aspect of the leg. FROM of the knee and ankle. She has a well healed scar over the top of the right foot. I'm unable to palpate a DP pulse. Pulse was dopplerable   Skin:    General: Skin is warm and dry.  Neurological:     Mental Status: She is alert and oriented to person, place, and time.  Psychiatric:        Behavior: Behavior normal.      ED Treatments / Results  Labs (all labs ordered are listed, but only abnormal results are displayed) Labs Reviewed - No data to display  EKG None  Radiology US Venous Img Lower Right (dvt Study)  Result Date: 06/19/2019 CLINICAL DATA:  Leg pain EXAM: RIGHT LOWER EXTREMITY VENOUS DOPPLER ULTRASOUND TECHNIQUE: Gray-scale sonography with graded compression, as well as color Doppler and duplex ultrasound were performed to evaluate the lower extremity deep venous systems from the level of the common femoral vein and including the common femoral, femoral, profunda femoral, popliteal and calf veins including the posterior tibial, peroneal and gastrocnemius veins when visible. The superficial great saphenous vein was also interrogated. Spectral Doppler was utilized to evaluate flow at rest and with distal augmentation maneuvers in the common femoral, femoral and popliteal veins. COMPARISON:  04/16/2019 FINDINGS: Contralateral Common Femoral Vein: Respiratory phasicity is normal and symmetric with the symptomatic side. No evidence of thrombus. Normal compressibility. Common Femoral Vein: No evidence of thrombus. Normal compressibility, respiratory phasicity and response to augmentation. Saphenofemoral Junction: No evidence of thrombus. Normal compressibility and flow on color Doppler imaging. Profunda Femoral Vein: No evidence of thrombus. Normal compressibility and flow on color Doppler imaging. Femoral Vein: No evidence of thrombus. Normal compressibility, respiratory phasicity and response to  augmentation. Popliteal Vein: No evidence of thrombus. Normal compressibility, respiratory phasicity and response to augmentation. Calf Veins: No evidence of thrombus. Normal compressibility and flow on color Doppler imaging. Superficial Great Saphenous Vein: No evidence of thrombus. Normal compressibility. Venous Reflux:  Not evaluated on this exam Other Findings:  None. IMPRESSION: No evidence of deep venous thrombosis. Electronically Signed   By: Katherine Mantle M.D.   On: 06/19/2019 20:44    Procedures Procedures (including critical care time)  Medications Ordered in ED Medications - No data to display   Initial Impression / Assessment and Plan / ED Course  I have reviewed the triage vital signs and the  nursing notes.  Pertinent labs & imaging results that were available during my care of the patient were reviewed by me and considered in my medical decision making (see chart for details).  53 year old female presents with chronic R leg pain. She is mildly hypertensive but otherwise vitals are normal. She has no significant tenderness on exam. I cannot palpate a DP pulse in her R foot and it is easily palpated in the L foot. I was able to doppler a faint pulse. Her symptoms may be due to claudication. Repeat DVT study was obtained and is negative again. Advised f/u with vascular surgery for ABI  Final Clinical Impressions(s) / ED Diagnoses   Final diagnoses:  Right leg pain    ED Discharge Orders    None       Bethel BornGekas, Yamari Ventola Marie, PA-C 06/19/19 2249    Charlynne PanderYao, David Hsienta, MD 06/20/19 1500

## 2019-12-25 ENCOUNTER — Emergency Department (HOSPITAL_BASED_OUTPATIENT_CLINIC_OR_DEPARTMENT_OTHER)
Admission: EM | Admit: 2019-12-25 | Discharge: 2019-12-25 | Disposition: A | Discharge status: Home / Self Care | Payer: 59 | Attending: Emergency Medicine | Admitting: Emergency Medicine

## 2019-12-25 ENCOUNTER — Encounter (HOSPITAL_BASED_OUTPATIENT_CLINIC_OR_DEPARTMENT_OTHER): Payer: Self-pay | Admitting: Emergency Medicine

## 2019-12-25 ENCOUNTER — Other Ambulatory Visit: Payer: Self-pay

## 2019-12-25 DIAGNOSIS — I10 Essential (primary) hypertension: Secondary | ICD-10-CM | POA: Diagnosis not present

## 2019-12-25 DIAGNOSIS — Z79899 Other long term (current) drug therapy: Secondary | ICD-10-CM | POA: Diagnosis not present

## 2019-12-25 DIAGNOSIS — M542 Cervicalgia: Secondary | ICD-10-CM | POA: Diagnosis not present

## 2019-12-25 DIAGNOSIS — H9209 Otalgia, unspecified ear: Secondary | ICD-10-CM | POA: Diagnosis present

## 2019-12-25 MED ORDER — METHOCARBAMOL 500 MG PO TABS: 1000.0000 mg | ORAL_TABLET | Freq: Four times a day (QID) | ORAL | 0 refills | Status: DC

## 2019-12-25 NOTE — ED Triage Notes (Signed)
L ear pain x 2 days. 

## 2019-12-25 NOTE — ED Notes (Signed)
ED Provider Josh, PA at bedside. 

## 2019-12-25 NOTE — ED Provider Notes (Signed)
[facility] EMERGENCY DEPARTMENT Provider Note   CSN: [id number] Arrival date & time: 12/25/19  1235     History Chief Complaint  Patient presents with  . Otalgia    Krystal Castillo is a 54 y.o. female.  Patient with history of hypertension presents the emergency department today for "ear pain".  Patient [facility] and was prescribed a 10-day course of Augmentin.  Patient states that this did not help her symptoms.  She states that the pain was getting worse.  She denies any significant swelling.  Pain is in the left neck and is worse with looking towards the left.  Patient has an aching pain that radiates down into her left arm.  She denies any numbness or tingling in her hand.  No difficulty walking.  No difficulty breathing or swallowing.  No fevers.  No other treatments prior to arrival.        Past Medical History:  Diagnosis Date  . Hypertension     There are no problems to display for this patient.   Past Surgical History:  Procedure Laterality Date  . CHOLECYSTECTOMY    . FOOT SURGERY    . THYROID SURGERY    . TONSILLECTOMY    . TUBAL LIGATION       OB History   No obstetric history on file.     No family history on file.  Social History   Tobacco Use  . Smoking status: Never Smoker  . Smokeless tobacco: Never Used  Substance Use Topics  . Alcohol use: No  . Drug use: No    Home Medications Prior to Admission medications   Medication Sig Start Date End Date Taking? Authorizing Provider  atenolol (TENORMIN) 100 MG tablet Take by mouth daily.      [provider]  hydrochlorothiazide (MICROZIDE) 12.5 MG capsule Take 12.5 mg by mouth daily.    [provider]  methocarbamol (ROBAXIN) 500 MG tablet Take 2 tablets (1,000 mg total) by mouth 4 (four) times daily. 12/25/19   Renne Crigler, PA-C    Allergies    Cefdinir and Doxycycline  Review of Systems   Review of Systems    Constitutional: Negative for chills, fatigue and fever.  HENT: Positive for ear pain. Negative for congestion, hearing loss, rhinorrhea, sinus pressure and sore throat.   Eyes: Negative for redness.  Respiratory: Negative for cough.   Gastrointestinal: Negative for vomiting.  Musculoskeletal: Positive for myalgias and neck pain.  Neurological: Negative for weakness, numbness and headaches.    Physical Exam Updated Vital Signs BP (!) 176/94 (BP Location: Right Arm)   Pulse 76   Temp 99 F (37.2 C) (Oral)   Resp 18   Ht 5\' 6"  (1.676 m)   Wt 100.2 kg   SpO2 99%   BMI 35.67 kg/m   Physical Exam Vitals and nursing note reviewed.  Constitutional:      Appearance: She is well-developed.     Comments: When I entered the exam room patient has her hand and is rubbing on the lateral neck inferior to the ear.  HENT:     Head: Normocephalic and atraumatic.     Right Ear: Tympanic membrane, ear canal and external ear normal.     Left Ear: Tympanic membrane, ear canal and external ear normal.     Nose: Nose normal. No congestion.     Mouth/Throat:     Mouth: Mucous membranes are moist.  Eyes:     Conjunctiva/sclera: Conjunctivae normal.  Neck:      Comments: Slightly decreased rotation towards the left due to pain.  There is tenderness along the lateral aspect of the neck without swelling, erythema or redness.  I do palpate a few small cervical chain lymph nodes, however this is bilaterally on the neck. Pulmonary:     Effort: No respiratory distress.  Musculoskeletal:     Cervical back: Neck supple. No signs of trauma. Pain with movement present. Decreased range of motion.     Thoracic back: Normal.  Skin:    General: Skin is warm and dry.  Neurological:     Mental Status: She is alert.     ED Results / Procedures / Treatments   Labs (all labs ordered are listed, but only abnormal results are displayed) Labs Reviewed - No data to display  EKG None  Radiology No results  found.  Procedures Procedures (including critical care time)  Medications Ordered in ED Medications - No data to display  ED Course  I have reviewed the triage vital signs and the nursing notes.  Pertinent labs & imaging results that were available during my care of the patient were reviewed by me and considered in my medical decision making (see chart for details).  Patient seen and examined.  Previous evaluations in epic reviewed.  Patient has been prescribed Augmentin 4 times in the past 4 months.  Vital signs reviewed and are as follows: BP (!) 176/94 (BP Location: Right Arm)   Pulse 76   Temp 99 F (37.2 C) (Oral)   Resp 18   Ht 5\' 6"  (1.676 m)   Wt 100.2 kg   SpO2 99%   BMI 35.67 kg/m   Patient's pain seems mostly musculoskeletal in nature.  She will be given a course of Robaxin.  I offered an x-ray of her neck to evaluate for the possibility of arthritis but she declines.  We discussed that nerve impingement to do a bulging disc is also possibility given that she has pain that moves down into her arm.  She will follow-up with her PCP for further evaluation of this if her symptoms are not improving.  Encouraged return with worsening.  Patient counseled on proper use of muscle relaxant medication.  They were told not to drink alcohol, drive any vehicle, or do any dangerous activities while taking this medication.  Patient verbalized understanding.    MDM Rules/Calculators/A&P                      Patient presents with complaint of ear pain however the pain is actually located in the lateral neck inferior to the ear.  It is worse with movement.  Patient does have some tenderness to palpation.  No significant swelling.  No airway compromise noted.  This is most likely musculoskeletal in nature.  I cannot entirely rule out a cervical spine etiology as she does have some pain that radiates down into her arm.  She denies any chest pain.  I doubt ACS given her pattern of symptoms.   She does not have any associated chest pressure, shortness of breath, diaphoresis, vomiting.    Final Clinical Impression(s) / ED Diagnoses Final diagnoses:  Neck pain    Rx / DC Orders ED Discharge Orders         Ordered    methocarbamol (ROBAXIN) 500 MG tablet  4 times daily     12/25/19 1343  Renne Crigler, PA-C 12/25/19 1352    Arby Barrette, MD 01/05/20 1348

## 2019-12-25 NOTE — Discharge Instructions (Signed)
Please read and follow all provided instructions.  Your diagnoses today include:  1. Neck pain    Tests performed today include:  Vital signs - see below for your results today  Medications prescribed:   Robaxin (methocarbamol) - muscle relaxer medication  DO NOT drive or perform any activities that require you to be awake and alert because this medicine can make you drowsy.   Take any prescribed medications only as directed.  Home care instructions:   Follow any educational materials contained in this packet  Please rest, use heat on your neck for the next several days  Follow-up instructions: Please follow-up with your primary care provider in the next 3-5 for further evaluation of your symptoms if not improving.   Return instructions:  SEEK IMMEDIATE MEDICAL ATTENTION IF YOU HAVE:  New numbness, tingling, weakness, or problem with the use of your arms or legs  Severe back pain not relieved with medications  Loss control of your bowels or bladder  Increasing pain in any areas of the body (such as chest or abdominal pain)  Shortness of breath, dizziness, or fainting.   Worsening nausea (feeling sick to your stomach), vomiting, fever, or sweats  Any other emergent concerns regarding your health   Additional Information:  Your vital signs today were: BP (!) 176/94 (BP Location: Right Arm)   Pulse 76   Temp 99 F (37.2 C) (Oral)   Resp 18   Ht 5\' 6"  (1.676 m)   Wt 100.2 kg   SpO2 99%   BMI 35.67 kg/m  If your blood pressure (BP) was elevated above 135/85 this visit, please have this repeated by your doctor within one month. --------------

## 2020-03-02 ENCOUNTER — Other Ambulatory Visit: Payer: Self-pay

## 2020-03-02 ENCOUNTER — Encounter (HOSPITAL_BASED_OUTPATIENT_CLINIC_OR_DEPARTMENT_OTHER): Payer: Self-pay | Admitting: Emergency Medicine

## 2020-03-02 ENCOUNTER — Emergency Department (HOSPITAL_BASED_OUTPATIENT_CLINIC_OR_DEPARTMENT_OTHER)
Admission: EM | Admit: 2020-03-02 | Discharge: 2020-03-02 | Disposition: A | Discharge status: Home / Self Care | Payer: 59 | Attending: Emergency Medicine | Admitting: Emergency Medicine

## 2020-03-02 DIAGNOSIS — Y999 Unspecified external cause status: Secondary | ICD-10-CM | POA: Insufficient documentation

## 2020-03-02 DIAGNOSIS — I10 Essential (primary) hypertension: Secondary | ICD-10-CM | POA: Diagnosis not present

## 2020-03-02 DIAGNOSIS — Y939 Activity, unspecified: Secondary | ICD-10-CM | POA: Insufficient documentation

## 2020-03-02 DIAGNOSIS — S61431A Puncture wound without foreign body of right hand, initial encounter: Secondary | ICD-10-CM | POA: Diagnosis not present

## 2020-03-02 DIAGNOSIS — Z23 Encounter for immunization: Secondary | ICD-10-CM | POA: Insufficient documentation

## 2020-03-02 DIAGNOSIS — Y929 Unspecified place or not applicable: Secondary | ICD-10-CM | POA: Diagnosis not present

## 2020-03-02 DIAGNOSIS — S60921A Unspecified superficial injury of right hand, initial encounter: Secondary | ICD-10-CM | POA: Diagnosis present

## 2020-03-02 DIAGNOSIS — K047 Periapical abscess without sinus: Secondary | ICD-10-CM | POA: Diagnosis not present

## 2020-03-02 MED ORDER — FLUCONAZOLE 150 MG PO TABS: ORAL_TABLET | ORAL | 0 refills | Status: DC

## 2020-03-02 MED ORDER — AMOXICILLIN 500 MG PO CAPS: 500.0000 mg | ORAL_CAPSULE | Freq: Three times a day (TID) | ORAL | 0 refills | Status: DC

## 2020-03-02 MED ORDER — AMOXICILLIN 500 MG PO CAPS
1000.0000 mg | ORAL_CAPSULE | Freq: Once | ORAL | Status: AC
  Administered 2020-03-02: 1000 mg via ORAL
  Filled 2020-03-02: qty 2

## 2020-03-02 MED ORDER — TETANUS-DIPHTH-ACELL PERTUSSIS 5-2.5-18.5 LF-MCG/0.5 IM SUSP
0.5000 mL | Freq: Once | INTRAMUSCULAR | Status: AC
  Administered 2020-03-02: 0.5 mL via INTRAMUSCULAR
  Filled 2020-03-02: qty 0.5

## 2020-03-02 NOTE — ED Triage Notes (Signed)
Pt c/o tooth pain and has puncture wound from metal blade at work yesterday. Pt states she needs a Tdap.

## 2020-03-02 NOTE — ED Provider Notes (Signed)
[facility] Provider Note: [name], MD, FACEP  CSN: [id number] MRN: [id number] ARRIVAL: 03/02/20 at 0602 ROOM: [facility]12/[facility]12   CHIEF COMPLAINT  Dental Pain and Puncture Wound   HISTORY OF PRESENT ILLNESS  03/02/20 6:14 AM Krystal Castillo is a 54 y.o. female who stabbed the back of her right hand with an [employer] knife at work 3 days ago.  The wound is healing well but she is here requesting a tetanus shot.  She has also had pain and swelling of the gum and cheek adjacent to her right lower second molar.  She rates associated pain is a 9 out of 10, worse with chewing.  She does have a dentist with whom she can follow-up.   Past Medical History:  Diagnosis Date  . Hypertension     Past Surgical History:  Procedure Laterality Date  . CHOLECYSTECTOMY    . FOOT SURGERY    . THYROID SURGERY    . TONSILLECTOMY    . TUBAL LIGATION      No family history on file.  Social History   Tobacco Use  . Smoking status: Never Smoker  . Smokeless tobacco: Never Used  Vaping Use  . Vaping Use: Never used  Substance Use Topics  . Alcohol use: No  . Drug use: No    Prior to Admission medications   Medication Sig Start Date End Date Taking? Authorizing Provider  amoxicillin (AMOXIL) 500 MG capsule Take 1 capsule (500 mg total) by mouth 3 (three) times daily. 03/02/20   Yasaman Kolek, MD  atenolol (TENORMIN) 100 MG tablet Take by mouth daily.      [provider]  fluconazole (DIFLUCAN) 150 MG tablet Take 1 tablet if needed for vaginal yeast infection.  May repeat in 3 days if symptoms persist. 03/02/20   Prairie Stenberg, Jonny Ruiz, MD  hydrochlorothiazide (MICROZIDE) 12.5 MG capsule Take 12.5 mg by mouth daily.    [provider]  methocarbamol (ROBAXIN) 500 MG tablet Take 2 tablets (1,000 mg total) by mouth 4 (four) times daily. 12/25/19   Renne Crigler, PA-C    Allergies Cefdinir and Doxycycline   REVIEW OF SYSTEMS  Negative except as noted here or in the History of  Present Illness.   PHYSICAL EXAMINATION  Initial Vital Signs Blood pressure (!) 168/84, pulse 65, temperature 98.5 F (36.9 C), temperature source Oral, resp. rate 18, height 5\' 7"  (1.702 m), weight 99.4 kg, SpO2 100 %.  Examination General: Well-developed, well-nourished female in no acute distress; appearance consistent with age of record HENT: normocephalic; atraumatic; right lower second molar with amalgam filling, no obvious untreated caries, with adjacent soft tissue tenderness and swelling Eyes: Normal appearance Neck: supple Heart: regular rate and rhythm Lungs: clear to auscultation bilaterally Abdomen: soft; nondistended; nontender; bowel sounds present Extremities: No deformity; full range of motion Neurologic: Awake, alert and oriented; motor function intact in all extremities and symmetric; no facial droop Skin: Warm and dry; will healing superficial puncture wound to dorsal right hand without signs of infection Psychiatric: Normal mood and affect   RESULTS  Summary of this visit's results, reviewed and interpreted by myself:   EKG Interpretation  Date/Time:    Ventricular Rate:    PR Interval:    QRS Duration:   QT Interval:    QTC Calculation:   R Axis:     Text Interpretation:        Laboratory Studies: No results found for this or any previous visit (from the past 24 hour(s)).  Imaging Studies: No results found.  ED COURSE and MDM  Nursing notes, initial and subsequent vitals signs, including pulse oximetry, reviewed and interpreted by myself.  Vitals:   03/02/20 0609 03/02/20 0611 03/02/20 0612  BP:   (!) 168/84  Pulse:   65  Resp:   18  Temp:  98.5 F (36.9 C)   TempSrc:  Oral   SpO2:   100%  Weight: 99.4 kg    Height: 5\' 7"  (1.702 m)     Medications  Tdap (BOOSTRIX) injection 0.5 mL (has no administration in time range)  amoxicillin (AMOXIL) capsule 1,000 mg (has no administration in time range)    We will start patient on amoxicillin  for presumed dental infection and refer her to her dentist.  We will also update her tetanus status.  PROCEDURES  Procedures   ED DIAGNOSES     ICD-10-CM   1. Puncture wound of right hand without foreign body, initial encounter  S61.431A   2. Dental infection  K04.7        Reghan Thul, , MD 03/02/20 [phone]

## 2020-08-01 ENCOUNTER — Encounter (HOSPITAL_BASED_OUTPATIENT_CLINIC_OR_DEPARTMENT_OTHER): Payer: Self-pay

## 2020-08-01 ENCOUNTER — Other Ambulatory Visit: Payer: Self-pay

## 2020-08-01 ENCOUNTER — Emergency Department (HOSPITAL_BASED_OUTPATIENT_CLINIC_OR_DEPARTMENT_OTHER): Payer: Managed Care, Other (non HMO)

## 2020-08-01 ENCOUNTER — Emergency Department (HOSPITAL_BASED_OUTPATIENT_CLINIC_OR_DEPARTMENT_OTHER)
Admission: EM | Admit: 2020-08-01 | Discharge: 2020-08-01 | Disposition: A | Discharge status: Home / Self Care | Payer: Managed Care, Other (non HMO) | Attending: Emergency Medicine | Admitting: Emergency Medicine

## 2020-08-01 DIAGNOSIS — Z79899 Other long term (current) drug therapy: Secondary | ICD-10-CM | POA: Diagnosis not present

## 2020-08-01 DIAGNOSIS — Z7984 Long term (current) use of oral hypoglycemic drugs: Secondary | ICD-10-CM | POA: Diagnosis not present

## 2020-08-01 DIAGNOSIS — R002 Palpitations: Secondary | ICD-10-CM | POA: Diagnosis present

## 2020-08-01 DIAGNOSIS — E119 Type 2 diabetes mellitus without complications: Secondary | ICD-10-CM | POA: Insufficient documentation

## 2020-08-01 DIAGNOSIS — I10 Essential (primary) hypertension: Secondary | ICD-10-CM | POA: Diagnosis not present

## 2020-08-01 HISTORY — DX: Type 2 diabetes mellitus without complications: E11.9

## 2020-08-01 LAB — BASIC METABOLIC PANEL
Anion gap: 10 (ref 5–15)
BUN: 15 mg/dL (ref 6–20)
CO2: 28 mmol/L (ref 22–32)
Calcium: 9.1 mg/dL (ref 8.9–10.3)
Chloride: 98 mmol/L (ref 98–111)
Creatinine, Ser: 0.93 mg/dL (ref 0.44–1.00)
GFR, Estimated: >60 mL/min (ref >60)
Glucose, Bld: 150 mg/dL — ABNORMAL HIGH (ref 70–99)
Potassium: 3.2 mmol/L — ABNORMAL LOW (ref 3.5–5.1)
Sodium: 136 mmol/L (ref 135–145)

## 2020-08-01 LAB — CBC
HCT: 39.1 % (ref 36.0–46.0)
Hemoglobin: 13.3 g/dL (ref 12.0–15.0)
MCH: 29.6 pg (ref 26.0–34.0)
MCHC: 34 g/dL (ref 30.0–36.0)
MCV: 86.9 fL (ref 80.0–100.0)
Platelets: 164 10*3/uL (ref 150–400)
RBC: 4.5 MIL/uL (ref 3.87–5.11)
RDW: 13.3 % (ref 11.5–15.5)
WBC: 6.5 10*3/uL (ref 4.0–10.5)
nRBC: 0 % (ref 0.0–0.2)

## 2020-08-01 LAB — CBG MONITORING, ED: Glucose-Capillary: 126 mg/dL — ABNORMAL HIGH (ref 70–99)

## 2020-08-01 LAB — PREGNANCY, URINE: Preg Test, Ur: NEGATIVE

## 2020-08-01 NOTE — Discharge Instructions (Signed)
Follow-up with your primary doctor regarding ongoing medication management.  Return to ER if you have any additional episodes of palpitations, chest pain or other new concerning symptom.

## 2020-08-01 NOTE — ED Triage Notes (Signed)
Pt began on glipizide due to T2DM, took one today and began having a fluttering sensation in chest. Denies chest pain or SOB.. Some lightheadedness,

## 2020-08-02 NOTE — ED Provider Notes (Signed)
[place] EMERGENCY DEPARTMENT Provider Note   CSN: [id number] Arrival date & time: 08/01/20  1819     History Chief Complaint  Patient presents with  . Palpitations    Krystal Castillo is a 55 y.o. female.  States that earlier today she took the first pill of a newly prescribed diabetes medication, glipizide.  States that she soon after started to have a fluttering sensation in her chest.  This lasted for few minutes but then started to ease off.  She currently does not have any ongoing symptoms.  No episodes of passing out.  States that she was diagnosed with diabetes approximately 1 year ago and had a similar reaction to Metformin.  Was discontinued on Metformin and attempted to treat diabetes with diet and exercise.  States that she had a recent A1c check which did not improve and her primary doctor recommended starting this glipizide.  Has history of hypertension, besides this and diabetes no other medical problems.  HPI     Past Medical History:  Diagnosis Date  . Diabetes mellitus without complication ([place])   . Hypertension     There are no problems to display for this patient.   Past Surgical History:  Procedure Laterality Date  . CHOLECYSTECTOMY    . FOOT SURGERY    . THYROID SURGERY    . TONSILLECTOMY    . TUBAL LIGATION       OB History   No obstetric history on file.     History reviewed. No pertinent family history.  Social History   Tobacco Use  . Smoking status: Never Smoker  . Smokeless tobacco: Never Used  Vaping Use  . Vaping Use: Never used  Substance Use Topics  . Alcohol use: No  . Drug use: No    Home Medications Prior to Admission medications   Medication Sig Start Date End Date Taking? Authorizing Provider  amoxicillin (AMOXIL) 500 MG capsule Take 1 capsule (500 mg total) by mouth 3 (three) times daily. 03/02/20   Molpus, John, MD  atenolol (TENORMIN) 100 MG tablet Take by mouth daily.      [provider]   fluconazole (DIFLUCAN) 150 MG tablet Take 1 tablet if needed for vaginal yeast infection.  May repeat in 3 days if symptoms persist. 03/02/20   Molpus, Jenny Reichmann, MD  hydrochlorothiazide (MICROZIDE) 12.5 MG capsule Take 12.5 mg by mouth daily.    [provider]  methocarbamol (ROBAXIN) 500 MG tablet Take 2 tablets (1,000 mg total) by mouth 4 (four) times daily. 12/25/19   Carlisle Cater, PA-C    Allergies    Cefdinir and Doxycycline  Review of Systems   Review of Systems  Constitutional: Negative for chills and fever.  HENT: Negative for ear pain and sore throat.   Eyes: Negative for pain and visual disturbance.  Respiratory: Negative for cough and shortness of breath.   Cardiovascular: Positive for palpitations. Negative for chest pain.  Gastrointestinal: Negative for abdominal pain and vomiting.  Genitourinary: Negative for dysuria and hematuria.  Musculoskeletal: Negative for arthralgias and back pain.  Skin: Negative for color change and rash.  Neurological: Negative for seizures and syncope.  All other systems reviewed and are negative.   Physical Exam Updated Vital Signs BP (!) 162/79 (BP Location: Right Arm)   Pulse 79   Temp 98.6 F (37 C) (Oral)   Resp 18   Ht 5\' 6"  (1.676 m)   Wt 98.9 kg   SpO2 100%   BMI 35.19  kg/m   Physical Exam Vitals and nursing note reviewed.  Constitutional:      General: She is not in acute distress.    Appearance: She is well-developed and well-nourished.  HENT:     Head: Normocephalic and atraumatic.  Eyes:     Conjunctiva/sclera: Conjunctivae normal.  Cardiovascular:     Rate and Rhythm: Normal rate and regular rhythm.     Heart sounds: No murmur heard.   Pulmonary:     Effort: Pulmonary effort is normal. No respiratory distress.     Breath sounds: Normal breath sounds.  Abdominal:     Palpations: Abdomen is soft.     Tenderness: There is no abdominal tenderness.  Musculoskeletal:        General: No edema.      Cervical back: Neck supple.  Skin:    General: Skin is warm and dry.  Neurological:     Mental Status: She is alert.  Psychiatric:        Mood and Affect: Mood and affect normal.     ED Results / Procedures / Treatments   Labs (all labs ordered are listed, but only abnormal results are displayed) Labs Reviewed  BASIC METABOLIC PANEL - Abnormal; Notable for the following components:      Result Value   Potassium 3.2 (*)    Glucose, Bld 150 (*)    All other components within normal limits  CBG MONITORING, ED - Abnormal; Notable for the following components:   Glucose-Capillary 126 (*)    All other components within normal limits  CBC  PREGNANCY, URINE    EKG EKG Interpretation  Date/Time:  Tuesday August 01 2020 18:27:36 EST Ventricular Rate:  80 PR Interval:  172 QRS Duration: 98 QT Interval:  390 QTC Calculation: 449 R Axis:   40 Text Interpretation: Normal sinus rhythm Confirmed by Marianna Fuss ([id number]) on 08/01/2020 10:19:00 PM   Radiology DG Chest 2 View  Result Date: 08/01/2020 CLINICAL DATA:  Chest palpitations EXAM: CHEST - 2 VIEW COMPARISON:  05/14/2017 chest radiograph. FINDINGS: Stable cardiomediastinal silhouette with normal heart size. No pneumothorax. No pleural effusion. Lungs appear clear, with no acute consolidative airspace disease and no pulmonary edema. Surgical clips seen in the right upper quadrant of the abdomen. IMPRESSION: No active cardiopulmonary disease. Electronically Signed   By: Delbert Phenix M.D.   On: 08/01/2020 19:42    Procedures Procedures (including critical care time)  Medications Ordered in ED Medications - No data to display  ED Course  I have reviewed the triage vital signs and the nursing notes.  Pertinent labs & imaging results that were available during my care of the patient were reviewed by me and considered in my medical decision making (see chart for details).    MDM Rules/Calculators/A&P                          55 year old lady presents to ER with concern for episode of palpitations after taking 1 glipizide pill.  On physical exam, she is well-appearing with no ongoing symptoms and normal vital signs.  She is in a normal sinus rhythm.  Her work-up is reassuring today.  I recommended discussing medication management with her primary doctor.   After the discussed management above, the patient was determined to be safe for discharge.  The patient was in agreement with this plan and all questions regarding their care were answered.  ED return precautions were discussed and the patient will  return to the ED with any significant worsening of condition.  Final Clinical Impression(s) / ED Diagnoses Final diagnoses:  Palpitations    Rx / DC Orders ED Discharge Orders    None       Milagros Loll, MD 08/02/20 1129

## 2020-09-18 ENCOUNTER — Encounter (HOSPITAL_BASED_OUTPATIENT_CLINIC_OR_DEPARTMENT_OTHER): Payer: Self-pay

## 2020-09-18 ENCOUNTER — Emergency Department (HOSPITAL_BASED_OUTPATIENT_CLINIC_OR_DEPARTMENT_OTHER): Payer: 59

## 2020-09-18 ENCOUNTER — Other Ambulatory Visit: Payer: Self-pay

## 2020-09-18 ENCOUNTER — Emergency Department (HOSPITAL_BASED_OUTPATIENT_CLINIC_OR_DEPARTMENT_OTHER)
Admission: EM | Admit: 2020-09-18 | Discharge: 2020-09-18 | Disposition: A | Discharge status: Home / Self Care | Payer: 59 | Attending: Emergency Medicine | Admitting: Emergency Medicine

## 2020-09-18 DIAGNOSIS — R110 Nausea: Secondary | ICD-10-CM | POA: Insufficient documentation

## 2020-09-18 DIAGNOSIS — I10 Essential (primary) hypertension: Secondary | ICD-10-CM | POA: Diagnosis not present

## 2020-09-18 DIAGNOSIS — E119 Type 2 diabetes mellitus without complications: Secondary | ICD-10-CM | POA: Diagnosis not present

## 2020-09-18 DIAGNOSIS — Z79899 Other long term (current) drug therapy: Secondary | ICD-10-CM | POA: Diagnosis not present

## 2020-09-18 DIAGNOSIS — R1031 Right lower quadrant pain: Secondary | ICD-10-CM | POA: Diagnosis present

## 2020-09-18 LAB — URINALYSIS, ROUTINE W REFLEX MICROSCOPIC
Bilirubin Urine: NEGATIVE
Glucose, UA: >=500 mg/dL — AB
Hgb urine dipstick: NEGATIVE
Ketones, ur: NEGATIVE mg/dL
Nitrite: NEGATIVE
Protein, ur: NEGATIVE mg/dL
Specific Gravity, Urine: 1.03 (ref 1.005–1.030)
pH: 6 (ref 5.0–8.0)

## 2020-09-18 LAB — CBC WITH DIFFERENTIAL/PLATELET
Abs Immature Granulocytes: 0 10*3/uL (ref 0.00–0.07)
Basophils Absolute: 0 10*3/uL (ref 0.0–0.1)
Basophils Relative: 0 %
Eosinophils Absolute: 0.2 10*3/uL (ref 0.0–0.5)
Eosinophils Relative: 3 %
HCT: 39.7 % (ref 36.0–46.0)
Hemoglobin: 13.2 g/dL (ref 12.0–15.0)
Immature Granulocytes: 0 %
Lymphocytes Relative: 33 %
Lymphs Abs: 1.8 10*3/uL (ref 0.7–4.0)
MCH: 29.1 pg (ref 26.0–34.0)
MCHC: 33.2 g/dL (ref 30.0–36.0)
MCV: 87.6 fL (ref 80.0–100.0)
Monocytes Absolute: 0.2 10*3/uL (ref 0.1–1.0)
Monocytes Relative: 4 %
Neutro Abs: 3.2 10*3/uL (ref 1.7–7.7)
Neutrophils Relative %: 60 %
Platelets: 143 10*3/uL — ABNORMAL LOW (ref 150–400)
RBC: 4.53 MIL/uL (ref 3.87–5.11)
RDW: 13.3 % (ref 11.5–15.5)
WBC: 5.4 10*3/uL (ref 4.0–10.5)
nRBC: 0 % (ref 0.0–0.2)

## 2020-09-18 LAB — COMPREHENSIVE METABOLIC PANEL
ALT: 21 U/L (ref 0–44)
AST: 27 U/L (ref 15–41)
Albumin: 4 g/dL (ref 3.5–5.0)
Alkaline Phosphatase: 82 U/L (ref 38–126)
Anion gap: 7 (ref 5–15)
BUN: 17 mg/dL (ref 6–20)
CO2: 27 mmol/L (ref 22–32)
Calcium: 8.9 mg/dL (ref 8.9–10.3)
Chloride: 102 mmol/L (ref 98–111)
Creatinine, Ser: 0.83 mg/dL (ref 0.44–1.00)
GFR, Estimated: >60 mL/min (ref >60)
Glucose, Bld: 190 mg/dL — ABNORMAL HIGH (ref 70–99)
Potassium: 3.6 mmol/L (ref 3.5–5.1)
Sodium: 136 mmol/L (ref 135–145)
Total Bilirubin: 0.5 mg/dL (ref 0.3–1.2)
Total Protein: 7.6 g/dL (ref 6.5–8.1)

## 2020-09-18 LAB — URINALYSIS, MICROSCOPIC (REFLEX)

## 2020-09-18 LAB — LIPASE, BLOOD: Lipase: 41 U/L (ref 11–51)

## 2020-09-18 MED ORDER — IOHEXOL 300 MG/ML  SOLN
100.0000 mL | Freq: Once | INTRAMUSCULAR | Status: AC | PRN
  Administered 2020-09-18: 100 mL via INTRAVENOUS

## 2020-09-18 MED ORDER — ONDANSETRON HCL 4 MG/2ML IJ SOLN: 4.0000 mg | Freq: Once | INTRAMUSCULAR | Status: DC

## 2020-09-18 NOTE — ED Triage Notes (Signed)
Pt report intermittent RLQ abd pain x 1week with nausea, but no vomiting.

## 2020-09-18 NOTE — ED Notes (Signed)
Pt denies any nausea at this time and will hold the zofran until needed

## 2020-09-18 NOTE — Discharge Instructions (Addendum)
Discussed with your primary care doctor about cirrhosis changes to your liver.  Please return to the ED if symptoms worsen.  Recommend Motrin for any further pain.

## 2020-09-18 NOTE — ED Provider Notes (Signed)
[facility] EMERGENCY DEPARTMENT Provider Note   CSN: [id number] Arrival date & time: 09/18/20  4332     History Chief Complaint  Patient presents with  . Abdominal Pain    Krystal Castillo is a 55 y.o. female.  The history is provided by the patient.  Abdominal Pain Pain location:  RLQ Pain quality: aching   Pain radiates to:  Does not radiate Pain severity:  Mild Onset quality:  Gradual Timing:  Intermittent Progression:  Waxing and waning Chronicity:  New Context: previous surgery (GB removal)   Relieved by:  Nothing Worsened by:  Nothing Associated symptoms: nausea   Associated symptoms: no chest pain, no chills, no cough, no dysuria, no fever, no hematuria, no shortness of breath, no sore throat, no vaginal bleeding and no vomiting   Risk factors: multiple surgeries        Past Medical History:  Diagnosis Date  . Diabetes mellitus without complication ([facility])   . Hypertension     There are no problems to display for this patient.   Past Surgical History:  Procedure Laterality Date  . CHOLECYSTECTOMY    . FOOT SURGERY    . THYROID SURGERY    . TONSILLECTOMY    . TUBAL LIGATION       OB History   No obstetric history on file.     History reviewed. No pertinent family history.  Social History   Tobacco Use  . Smoking status: Never Smoker  . Smokeless tobacco: Never Used  Vaping Use  . Vaping Use: Never used  Substance Use Topics  . Alcohol use: No  . Drug use: No    Home Medications Prior to Admission medications   Medication Sig Start Date End Date Taking? Authorizing Provider  amoxicillin-clavulanate (AUGMENTIN) 875-125 MG tablet Take by mouth. 09/09/20 09/19/20 Yes [provider]  atenolol (TENORMIN) 100 MG tablet Take 1 tablet by mouth daily. 11/12/16  Yes [provider]  amoxicillin (AMOXIL) 500 MG capsule Take 1 capsule (500 mg total) by mouth 3 (three) times daily. 03/02/20   Molpus, John, MD  atenolol  (TENORMIN) 100 MG tablet Take by mouth daily.    [provider]  fluconazole (DIFLUCAN) 150 MG tablet Take 1 tablet if needed for vaginal yeast infection.  May repeat in 3 days if symptoms persist. 03/02/20   Molpus, Jonny Ruiz, MD  hydrochlorothiazide (HYDRODIURIL) 12.5 MG tablet 12.5 mg.    [provider]  hydrochlorothiazide (MICROZIDE) 12.5 MG capsule Take 12.5 mg by mouth daily.    [provider]  methocarbamol (ROBAXIN) 500 MG tablet Take 2 tablets (1,000 mg total) by mouth 4 (four) times daily. 12/25/19   Renne Crigler, PA-C    Allergies    Cefdinir and Doxycycline  Review of Systems   Review of Systems  Constitutional: Negative for chills and fever.  HENT: Negative for ear pain and sore throat.   Eyes: Negative for pain and visual disturbance.  Respiratory: Negative for cough and shortness of breath.   Cardiovascular: Negative for chest pain and palpitations.  Gastrointestinal: Positive for abdominal pain and nausea. Negative for vomiting.  Genitourinary: Negative for dysuria, hematuria, pelvic pain, vaginal bleeding and vaginal pain.  Musculoskeletal: Negative for arthralgias and back pain.  Skin: Negative for color change and rash.  Neurological: Negative for seizures and syncope.  All other systems reviewed and are negative.   Physical Exam Updated Vital Signs BP (!) 145/81 (BP Location: Left Arm)   Pulse 68   Temp  98.2 F (36.8 C) (Oral)   Resp 16   Ht 5\' 6"  (1.676 m)   Wt 99.3 kg   SpO2 97%   BMI 35.35 kg/m   Physical Exam Vitals and nursing note reviewed.  Constitutional:      General: She is not in acute distress.    Appearance: She is well-developed and well-nourished. She is not ill-appearing.  HENT:     Head: Normocephalic and atraumatic.     Nose: Nose normal.     Mouth/Throat:     Mouth: Mucous membranes are moist.  Eyes:     Extraocular Movements: Extraocular movements intact.     Conjunctiva/sclera: Conjunctivae normal.      Pupils: Pupils are equal, round, and reactive to light.  Cardiovascular:     Rate and Rhythm: Normal rate and regular rhythm.     Pulses: Normal pulses.     Heart sounds: Normal heart sounds. No murmur heard.   Pulmonary:     Effort: Pulmonary effort is normal. No respiratory distress.     Breath sounds: Normal breath sounds.  Abdominal:     Palpations: Abdomen is soft.     Tenderness: There is abdominal tenderness in the right lower quadrant. There is no right CVA tenderness, left CVA tenderness, guarding or rebound.     Hernia: No hernia is present.  Musculoskeletal:        General: No edema.     Cervical back: Neck supple.  Skin:    General: Skin is warm and dry.     Capillary Refill: Capillary refill takes less than 2 seconds.  Neurological:     General: No focal deficit present.     Mental Status: She is alert.  Psychiatric:        Mood and Affect: Mood and affect and mood normal.     ED Results / Procedures / Treatments   Labs (all labs ordered are listed, but only abnormal results are displayed) Labs Reviewed  CBC WITH DIFFERENTIAL/PLATELET - Abnormal; Notable for the following components:      Result Value   Platelets 143 (*)    All other components within normal limits  COMPREHENSIVE METABOLIC PANEL - Abnormal; Notable for the following components:   Glucose, Bld 190 (*)    All other components within normal limits  URINALYSIS, ROUTINE W REFLEX MICROSCOPIC - Abnormal; Notable for the following components:   Glucose, UA >=500 (*)    Leukocytes,Ua TRACE (*)    All other components within normal limits  URINALYSIS, MICROSCOPIC (REFLEX) - Abnormal; Notable for the following components:   Bacteria, UA FEW (*)    All other components within normal limits  LIPASE, BLOOD    EKG None  Radiology CT ABDOMEN PELVIS W CONTRAST  Result Date: 09/18/2020 CLINICAL DATA:  Acute right lower quadrant abdominal pain. EXAM: CT ABDOMEN AND PELVIS WITH CONTRAST TECHNIQUE:  Multidetector CT imaging of the abdomen and pelvis was performed using the standard protocol following bolus administration of intravenous contrast. CONTRAST:  09/20/2020 OMNIPAQUE IOHEXOL 300 MG/ML  SOLN COMPARISON:  None. FINDINGS: Lower chest: No acute abnormality. Hepatobiliary: Status post cholecystectomy. No biliary dilatation is noted. Hepatic cirrhosis is noted. No definite hepatic abnormality is noted. Pancreas: Unremarkable. No pancreatic ductal dilatation or surrounding inflammatory changes. Spleen: Normal in size without focal abnormality. Adrenals/Urinary Tract: Adrenal glands appear normal. Left renal atrophy is noted. Small cyst is seen in lower pole of left kidney. No hydronephrosis or renal obstruction is noted. Urinary bladder is unremarkable.  Stomach/Bowel: Stomach is within normal limits. Appendix appears normal. No evidence of bowel wall thickening, distention, or inflammatory changes. Diverticulosis of proximal sigmoid colon is noted without inflammation. Vascular/Lymphatic: No significant vascular findings are present. No enlarged abdominal or pelvic lymph nodes. Reproductive: Uterus and bilateral adnexa are unremarkable. Other: No abdominal wall hernia or abnormality. No abdominopelvic ascites. Musculoskeletal: Multilevel degenerative disc disease is noted in the lower lumbar spine. No acute osseous abnormality is noted. IMPRESSION: 1. Hepatic cirrhosis. 2. Left renal atrophy. 3. Diverticulosis of proximal sigmoid colon without inflammation. 4. No acute abnormality seen in the abdomen or pelvis. Electronically Signed   By: Lupita Raider M.D.   On: 09/18/2020 10:43    Procedures Procedures   Medications Ordered in ED Medications  ondansetron (ZOFRAN) injection 4 mg (0 mg Intravenous Hold 09/18/20 0926)  iohexol (OMNIPAQUE) 300 MG/ML solution 100 mL (100 mLs Intravenous Contrast Given 09/18/20 1017)    ED Course  I have reviewed the triage vital signs and the nursing notes.  Pertinent  labs & imaging results that were available during my care of the patient were reviewed by me and considered in my medical decision making (see chart for details).    MDM Rules/Calculators/A&P                          Waniya Hoglund is a 55 year old female with history of diabetes, hypertension who presents to the ED with abdominal pain.  Mostly in the right lower quadrant.  On and off for about a week.  Started again this morning.  No fever.  Unremarkable vitals.  Has had some nausea.  No vomiting.  Has history of gallbladder removal but no other abdominal surgeries.  Denies any vaginal bleeding.  Suspect may be appendicitis versus colitis versus bowel obstruction versus muscular process versus gas pain.  Will check lab work including CT scan abdomen and pelvis.  Patient with overall unremarkable lab work.  No UTI.  No significant anemia or leukocytosis.  No kidney stone or bowel obstruction on CT scan.  Incidentally found to have hepatic cirrhosis.  However liver enzymes within normal limits.  We will have her follow-up with her primary care doctor about that.  Recommend Motrin for pain.  Discharged in ED in good condition.  Understands return precautions.  This chart was dictated using voice recognition software.  Despite best efforts to proofread,  errors can occur which can change the documentation meaning.    Final Clinical Impression(s) / ED Diagnoses Final diagnoses:  Right lower quadrant abdominal pain    Rx / DC Orders ED Discharge Orders    None       Virgina Norfolk, DO 09/18/20 1132

## 2020-11-28 DIAGNOSIS — E119 Type 2 diabetes mellitus without complications: Secondary | ICD-10-CM | POA: Insufficient documentation

## 2021-01-21 ENCOUNTER — Other Ambulatory Visit: Payer: Self-pay

## 2021-01-21 ENCOUNTER — Emergency Department (HOSPITAL_BASED_OUTPATIENT_CLINIC_OR_DEPARTMENT_OTHER)
Admission: EM | Admit: 2021-01-21 | Discharge: 2021-01-21 | Disposition: A | Discharge status: Home / Self Care | Payer: 59 | Attending: Emergency Medicine | Admitting: Emergency Medicine

## 2021-01-21 ENCOUNTER — Encounter (HOSPITAL_BASED_OUTPATIENT_CLINIC_OR_DEPARTMENT_OTHER): Payer: Self-pay | Admitting: *Deleted

## 2021-01-21 DIAGNOSIS — E119 Type 2 diabetes mellitus without complications: Secondary | ICD-10-CM | POA: Diagnosis not present

## 2021-01-21 DIAGNOSIS — K0889 Other specified disorders of teeth and supporting structures: Secondary | ICD-10-CM | POA: Insufficient documentation

## 2021-01-21 DIAGNOSIS — R0981 Nasal congestion: Secondary | ICD-10-CM | POA: Diagnosis present

## 2021-01-21 DIAGNOSIS — J0100 Acute maxillary sinusitis, unspecified: Secondary | ICD-10-CM | POA: Insufficient documentation

## 2021-01-21 DIAGNOSIS — Z79899 Other long term (current) drug therapy: Secondary | ICD-10-CM | POA: Insufficient documentation

## 2021-01-21 DIAGNOSIS — I10 Essential (primary) hypertension: Secondary | ICD-10-CM | POA: Insufficient documentation

## 2021-01-21 MED ORDER — ACETAMINOPHEN 325 MG PO TABS
650.0000 mg | ORAL_TABLET | Freq: Once | ORAL | Status: AC | PRN
  Administered 2021-01-21: 650 mg via ORAL
  Filled 2021-01-21: qty 2

## 2021-01-21 MED ORDER — AMOXICILLIN-POT CLAVULANATE 875-125 MG PO TABS: 1.0000 | ORAL_TABLET | Freq: Two times a day (BID) | ORAL | 0 refills | Status: DC

## 2021-01-21 MED ORDER — IBUPROFEN 800 MG PO TABS: 800.0000 mg | ORAL_TABLET | Freq: Once | ORAL | Status: DC

## 2021-01-21 NOTE — Discharge Instructions (Addendum)
Follow up with your dentist and PCP.  Return for worsening symptoms.

## 2021-01-21 NOTE — ED Triage Notes (Addendum)
Left upper dental pain x 1 week.  Swelling noted. Tylenol taken at 2pm today

## 2021-01-21 NOTE — ED Notes (Signed)
See EDP assessment 

## 2021-01-21 NOTE — ED Provider Notes (Signed)
[facility] EMERGENCY DEPARTMENT Provider Note   CSN: [id number] Arrival date & time: 01/21/21  2003     History Chief Complaint  Patient presents with   Dental Pain    Krystal Castillo is a 55 y.o. female.  55 yo F with a chief complaints of left-sided facial pain.  Going on for couple days now.  Having trouble sleeping due to the pain.  No noted fevers or chills no cough.  Has had some congestion and some facial pain.  She thinks it is in her mouth as well.  No obvious tooth that is affected.  The history is provided by the patient.  Dental Pain Associated symptoms: congestion and facial swelling   Associated symptoms: no fever and no headaches   Illness Severity:  Moderate Onset quality:  Gradual Duration:  3 hours Timing:  Constant Progression:  Worsening Chronicity:  New Associated symptoms: congestion   Associated symptoms: no chest pain, no fever, no headaches, no myalgias, no nausea, no rhinorrhea, no shortness of breath, no vomiting and no wheezing       Past Medical History:  Diagnosis Date   Diabetes mellitus without complication ([facility])    Hypertension     There are no problems to display for this patient.   Past Surgical History:  Procedure Laterality Date   CHOLECYSTECTOMY     FOOT SURGERY     THYROID SURGERY     TONSILLECTOMY     TUBAL LIGATION       OB History   No obstetric history on file.     History reviewed. No pertinent family history.  Social History   Tobacco Use   Smoking status: Never   Smokeless tobacco: Never  Vaping Use   Vaping Use: Never used  Substance Use Topics   Alcohol use: No   Drug use: No    Home Medications Prior to Admission medications   Medication Sig Start Date End Date Taking? Authorizing Provider  amoxicillin-clavulanate (AUGMENTIN) 875-125 MG tablet Take 1 tablet by mouth 2 (two) times daily. One po bid x 7 days 01/21/21  Yes Melene Plan, DO  amoxicillin (AMOXIL) 500 MG capsule Take 1 capsule  (500 mg total) by mouth 3 (three) times daily. 03/02/20   Molpus, John, MD  atenolol (TENORMIN) 100 MG tablet Take by mouth daily.    [provider]  atenolol (TENORMIN) 100 MG tablet Take 1 tablet by mouth daily. 11/12/16   [provider]  fluconazole (DIFLUCAN) 150 MG tablet Take 1 tablet if needed for vaginal yeast infection.  May repeat in 3 days if symptoms persist. 03/02/20   Molpus, Jonny Ruiz, MD  hydrochlorothiazide (HYDRODIURIL) 12.5 MG tablet 12.5 mg.    [provider]  hydrochlorothiazide (MICROZIDE) 12.5 MG capsule Take 12.5 mg by mouth daily.    [provider]  methocarbamol (ROBAXIN) 500 MG tablet Take 2 tablets (1,000 mg total) by mouth 4 (four) times daily. 12/25/19   Renne Crigler, PA-C    Allergies    Cefdinir and Doxycycline  Review of Systems   Review of Systems  Constitutional:  Negative for chills and fever.  HENT:  Positive for congestion, facial swelling and sinus pain. Negative for rhinorrhea.   Eyes:  Negative for redness and visual disturbance.  Respiratory:  Negative for shortness of breath and wheezing.   Cardiovascular:  Negative for chest pain and palpitations.  Gastrointestinal:  Negative for nausea and vomiting.  Genitourinary:  Negative for dysuria and urgency.  Musculoskeletal:  Negative for arthralgias and myalgias.  Skin:  Negative for pallor and wound.  Neurological:  Negative for dizziness and headaches.   Physical Exam Updated Vital Signs BP (!) 170/87 (BP Location: Left Arm)   Pulse 100   Temp (!) 100.4 F (38 C) (Oral)   Resp 18   Ht 5\' 6"  (1.676 m)   Wt 96.7 kg   SpO2 97%   BMI 34.41 kg/m   Physical Exam Vitals and nursing note reviewed.  Constitutional:      General: She is not in acute distress.    Appearance: She is well-developed. She is not diaphoretic.  HENT:     Head: Normocephalic and atraumatic.     Comments: Swollen turbinates, posterior nasal drip, left maxillary sinus exquisitely tender  to percussion, tm normal bilaterally.      Mouth/Throat:     Comments: No obvious dental pain or percussion.  No palpable fluctuance or tenderness along the gumline.  Uvula is midline tolerate secretions without difficulty no trismus Eyes:     Pupils: Pupils are equal, round, and reactive to light.  Cardiovascular:     Rate and Rhythm: Normal rate and regular rhythm.     Heart sounds: No murmur heard.   No friction rub. No gallop.  Pulmonary:     Effort: Pulmonary effort is normal.     Breath sounds: No wheezing or rales.  Abdominal:     General: There is no distension.     Palpations: Abdomen is soft.     Tenderness: There is no abdominal tenderness.  Musculoskeletal:        General: No tenderness.     Cervical back: Normal range of motion and neck supple.  Skin:    General: Skin is warm and dry.  Neurological:     Mental Status: She is alert and oriented to person, place, and time.  Psychiatric:        Behavior: Behavior normal.    ED Results / Procedures / Treatments   Labs (all labs ordered are listed, but only abnormal results are displayed) Labs Reviewed - No data to display  EKG None  Radiology No results found.  Procedures Procedures   Medications Ordered in ED Medications  acetaminophen (TYLENOL) tablet 650 mg (650 mg Oral Given 01/21/21 2026)    ED Course  I have reviewed the triage vital signs and the nursing notes.  Pertinent labs & imaging results that were available during my care of the patient were reviewed by me and considered in my medical decision making (see chart for details).    MDM Rules/Calculators/A&P                          55 yo F with a chief complaints of left-sided facial pain.  By history and exam most likely sinusitis.  She was concerned about dental discomfort though no obvious dental pathology seen on exam.  Will start on antibiotics.  PCP follow-up.  9:01 PM:  I have discussed the diagnosis/risks/treatment options with the  patient and believe the pt to be eligible for discharge home to follow-up with PCP. We also discussed returning to the ED immediately if new or worsening sx occur. We discussed the sx which are most concerning (e.g., sudden worsening pain, fever, inability to tolerate by mouth) that necessitate immediate return. Medications administered to the patient during their visit and any new prescriptions provided to the patient are listed below.  Medications given during  this visit Medications  acetaminophen (TYLENOL) tablet 650 mg (650 mg Oral Given 01/21/21 2026)     The patient appears reasonably screen and/or stabilized for discharge and I doubt any other medical condition or other [facility] requiring further screening, evaluation, or treatment in the ED at this time prior to discharge.   Final Clinical Impression(s) / ED Diagnoses Final diagnoses:  Acute maxillary sinusitis, recurrence not specified    Rx / DC Orders ED Discharge Orders          Ordered    amoxicillin-clavulanate (AUGMENTIN) 875-125 MG tablet  2 times daily        01/21/21 2057             Melene Plan, DO 01/21/21 2101

## 2021-01-31 ENCOUNTER — Encounter: Payer: Self-pay | Admitting: Medical

## 2021-01-31 ENCOUNTER — Ambulatory Visit (INDEPENDENT_AMBULATORY_CARE_PROVIDER_SITE_OTHER): Payer: 59 | Admitting: Medical

## 2021-01-31 ENCOUNTER — Encounter: Payer: Self-pay | Admitting: *Deleted

## 2021-01-31 ENCOUNTER — Other Ambulatory Visit: Payer: Self-pay

## 2021-01-31 VITALS — BP 139/66 | HR 73 | Temp 99.8°F | Resp 18 | Ht 66.0 in | Wt 213.6 lb

## 2021-01-31 DIAGNOSIS — R0789 Other chest pain: Secondary | ICD-10-CM | POA: Diagnosis not present

## 2021-01-31 DIAGNOSIS — R002 Palpitations: Secondary | ICD-10-CM

## 2021-01-31 DIAGNOSIS — E119 Type 2 diabetes mellitus without complications: Secondary | ICD-10-CM

## 2021-01-31 DIAGNOSIS — I10 Essential (primary) hypertension: Secondary | ICD-10-CM | POA: Diagnosis not present

## 2021-01-31 DIAGNOSIS — Z1211 Encounter for screening for malignant neoplasm of colon: Secondary | ICD-10-CM | POA: Diagnosis not present

## 2021-01-31 NOTE — Patient Instructions (Addendum)
History of diabetes. Continue low sugar diet. Last a1c 7.6 less than 3 months ago. Put future a1c to be done early august. Please get scheduled to follow up with me on that day and will get a1c. Also call office with name of diabetic med last provider recommended. That may be adequate to get a1c to 6.5.  High triglycerides and low hdl on review. Please be aware statin are recommended to use in diabetic pt. Will prescribe low dose atorvastatin. You could discuss this with cardiologist as well if you are not comfortable taking just yet.  Rare atypical chest pain and palpitation both occurred around time starting prior diabetic meds. Will refer to cardiologist for evaluation and treatment. Ekg today showed normal sinus rhythm.(No current symptoms as well)  Referral to Gi md placed for screening colonoscopy.  Follow up early august or as needed

## 2021-01-31 NOTE — Progress Notes (Signed)
Subjective:    Patient ID: Krystal Castillo, female    DOB: 1965-11-07, 55 y.o.   MRN: [id number]  HPI  Pt in for first time. Works at [employer], regular exercise using airglider at house, nonsmoker,no alcohol. Moderate healthy diet.   Pt has htn hx. Pt is on atenolol 100 mg daily and hctz 12.5 mg daily.  Pt has diabetes. Pt states metformin caused her to have side effects. She states had heart palpitation and chest pressure(this was years ago). With glipizide she states caused her heart to race(this was in past year). Pt states new medication given to her in recent past but she is scared to take. Can't find 11-22-2020 a1c result in care everywhere. Pt can't remember the result. Pt does not check her sugars daily.  Pt currently just trying to control diabetes with diet. Last a1c found 11-22-20 was 7.6.  Pt never had colonoscopy. Will refer today.  Pt never had covid vaccine. She declines.     Review of Systems  Constitutional:  Negative for chills, fatigue and fever.  Respiratory:  Negative for cough, chest tightness, wheezing and stridor.   Cardiovascular:  Negative for chest pain and palpitations.  Gastrointestinal:  Negative for abdominal pain and blood in stool.  Genitourinary:  Negative for difficulty urinating, dysuria, flank pain and frequency.  Musculoskeletal:  Negative for back pain, gait problem and joint swelling.  Skin:  Negative for rash.  Neurological:  Negative for dizziness, speech difficulty, weakness, numbness and headaches.  Hematological:  Negative for adenopathy. Does not bruise/bleed easily.  Psychiatric/Behavioral:  Negative for behavioral problems, decreased concentration and sleep disturbance. The patient is not nervous/anxious.     Past Medical History:  Diagnosis Date   Diabetes mellitus without complication (HCC)    Hypertension      Social History   Socioeconomic History   Marital status: Single    Spouse name: Not on file   Number of children:  Not on file   Years of education: Not on file   Highest education level: Not on file  Occupational History   Not on file  Tobacco Use   Smoking status: Never   Smokeless tobacco: Never  Vaping Use   Vaping Use: Never used  Substance and Sexual Activity   Alcohol use: No   Drug use: No   Sexual activity: Not on file  Other Topics Concern   Not on file  Social History Narrative   Not on file   Social Determinants of Health   Financial Resource Strain: Not on file  Food Insecurity: Not on file  Transportation Needs: Not on file  Physical Activity: Not on file  Stress: Not on file  Social Connections: Not on file  Intimate Partner Violence: Not on file    Past Surgical History:  Procedure Laterality Date   CHOLECYSTECTOMY     FOOT SURGERY     THYROID SURGERY     TONSILLECTOMY     TUBAL LIGATION      No family history on file.  Allergies  Allergen Reactions   Cefdinir Itching   Doxycycline Palpitations    Current Outpatient Medications on File Prior to Visit  Medication Sig Dispense Refill   atenolol (TENORMIN) 100 MG tablet Take by mouth daily.     atenolol (TENORMIN) 100 MG tablet Take 1 tablet by mouth daily.     hydrochlorothiazide (HYDRODIURIL) 12.5 MG tablet 12.5 mg.     hydrochlorothiazide (MICROZIDE) 12.5 MG capsule Take 12.5 mg by mouth daily.  No current facility-administered medications on file prior to visit.    BP 139/66   Pulse 73   Temp 99.8 F (37.7 C)   Resp 18   Ht 5\' 6"  (1.676 m)   Wt 213 lb 9.6 oz (96.9 kg)   SpO2 95%   BMI 34.48 kg/m       Objective:   Physical Exam   General Mental Status- Alert. General Appearance- Not in acute distress.   Skin General: Color- Normal Color. Moisture- Normal Moisture.  Neck Carotid Arteries- Normal color. Moisture- Normal Moisture. No carotid bruits. No JVD.  Chest and Lung Exam Auscultation: Breath Sounds:-Normal.  Cardiovascular Auscultation:Rythm- Regular. Murmurs & Other  Heart Sounds:Auscultation of the heart reveals- No Murmurs.  Abdomen Inspection:-Inspeection Normal. Palpation/Percussion:Note:No mass. Palpation and Percussion of the abdomen reveal- Non Tender, Non Distended + BS, no rebound or guarding.    Neurologic Cranial Nerve exam:- CN III-XII intact(No nystagmus), symmetric smile.  Strength:- 5/5 equal and symmetric strength both upper and lower extremities.      Assessment & Plan:   History of diabetes. Continue low sugar diet. Last a1c 7.6 less than 3 months ago. Put future a1c to be done early august. Please get scheduled to follow up with me on that day and will get a1c. Also call office with name of diabetic med last provider recommended. That may be adequate to get a1c to 6.5.  High triglycerides and low hdl on review. Please be aware statin are recommended to use in diabetic pt. Will prescribe low dose atorvastatin. You could discuss this with cardiologist as well if you are not comfortable taking just yet.  Rare atypical chest pain and palpitation both occurred around time starting prior diabetic meds. Will refer to cardiologist for evaluation and treatment. Ekg today showed normal sinus rhythm.(No current symptoms as well)  Referral to Gi md placed for screening colonoscopy.  Follow up early august or as needed

## 2021-03-02 ENCOUNTER — Ambulatory Visit: Payer: 59 | Admitting: Family

## 2021-03-02 NOTE — Progress Notes (Incomplete)
Subjective:   By signing my name below, I, Shehryar Baig, attest that this documentation has been prepared under the direction and in the presence of Sandford Craze NP. 03/02/2021      Patient ID: Krystal Castillo, female    DOB: February 20, 1966, 55 y.o.   MRN: [id number]  No chief complaint on file.   HPI Patient is in today for a office visit.   Health Maintenance Due  Topic Date Due   COVID-19 Vaccine (1) Never done   URINE MICROALBUMIN  Never done   HIV Screening  Never done   Hepatitis C Screening  Never done   PAP SMEAR-Modifier  06/12/2014   Zoster Vaccines- Shingrix (1 of 2) Never done   INFLUENZA VACCINE  02/26/2021    Past Medical History:  Diagnosis Date   Diabetes mellitus without complication (HCC)    Hypertension     Past Surgical History:  Procedure Laterality Date   CHOLECYSTECTOMY     FOOT SURGERY     THYROID SURGERY     TONSILLECTOMY     TUBAL LIGATION      No family history on file.  Social History   Socioeconomic History   Marital status: Single    Spouse name: Not on file   Number of children: Not on file   Years of education: Not on file   Highest education level: Not on file  Occupational History   Not on file  Tobacco Use   Smoking status: Never   Smokeless tobacco: Never  Vaping Use   Vaping Use: Never used  Substance and Sexual Activity   Alcohol use: No   Drug use: No   Sexual activity: Not on file  Other Topics Concern   Not on file  Social History Narrative   Not on file   Social Determinants of Health   Financial Resource Strain: Not on file  Food Insecurity: Not on file  Transportation Needs: Not on file  Physical Activity: Not on file  Stress: Not on file  Social Connections: Not on file  Intimate Partner Violence: Not on file    Outpatient Medications Prior to Visit  Medication Sig Dispense Refill   atenolol (TENORMIN) 100 MG tablet Take by mouth daily.     atenolol (TENORMIN) 100 MG tablet Take 1 tablet by  mouth daily.     hydrochlorothiazide (HYDRODIURIL) 12.5 MG tablet 12.5 mg.     hydrochlorothiazide (MICROZIDE) 12.5 MG capsule Take 12.5 mg by mouth daily.     No facility-administered medications prior to visit.    Allergies  Allergen Reactions   Cefdinir Itching   Doxycycline Palpitations    ROS     Objective:    Physical Exam Constitutional:      General: She is not in acute distress.    Appearance: Normal appearance. She is not ill-appearing.  HENT:     Head: Normocephalic and atraumatic.     Right Ear: External ear normal.     Left Ear: External ear normal.  Eyes:     Extraocular Movements: Extraocular movements intact.     Pupils: Pupils are equal, round, and reactive to light.  Cardiovascular:     Rate and Rhythm: Normal rate and regular rhythm.     Heart sounds: Normal heart sounds. No murmur heard.   No gallop.  Pulmonary:     Effort: Pulmonary effort is normal. No respiratory distress.     Breath sounds: Normal breath sounds. No wheezing or rales.  Skin:  General: Skin is warm and dry.  Neurological:     Mental Status: She is alert and oriented to person, place, and time.  Psychiatric:        Behavior: Behavior normal.    There were no vitals taken for this visit. Wt Readings from Last 3 Encounters:  01/31/21 213 lb 9.6 oz (96.9 kg)  01/21/21 213 lb 3.2 oz (96.7 kg)  09/18/20 219 lb (99.3 kg)       Assessment & Plan:   Problem List Items Addressed This Visit   None    No orders of the defined types were placed in this encounter.   I, Sandford Craze NP, personally preformed the services described in this documentation.  All medical record entries made by the scribe were at my direction and in my presence.  I have reviewed the chart and discharge instructions (if applicable) and agree that the record reflects my personal performance and is accurate and complete. 03/02/2021   I,Shehryar Baig,acting as a Neurosurgeon for Lemont Fillers,  NP.,have documented all relevant documentation on the behalf of Lemont Fillers, NP,as directed by  Lemont Fillers, NP while in the presence of Lemont Fillers, NP.   Shehryar [employer]

## 2021-03-05 ENCOUNTER — Other Ambulatory Visit: Payer: Self-pay

## 2021-03-06 ENCOUNTER — Ambulatory Visit: Payer: 59 | Admitting: Cardiology

## 2021-03-07 ENCOUNTER — Encounter: Payer: Self-pay | Admitting: Medical

## 2021-03-07 ENCOUNTER — Ambulatory Visit (INDEPENDENT_AMBULATORY_CARE_PROVIDER_SITE_OTHER): Payer: 59 | Admitting: Medical

## 2021-03-07 ENCOUNTER — Other Ambulatory Visit: Payer: Self-pay

## 2021-03-07 VITALS — BP 130/70 | HR 79 | Resp 18 | Ht 66.0 in | Wt 213.6 lb

## 2021-03-07 DIAGNOSIS — E7849 Other hyperlipidemia: Secondary | ICD-10-CM

## 2021-03-07 DIAGNOSIS — I10 Essential (primary) hypertension: Secondary | ICD-10-CM | POA: Diagnosis not present

## 2021-03-07 DIAGNOSIS — E1100 Type 2 diabetes mellitus with hyperosmolarity without nonketotic hyperglycemic-hyperosmolar coma (NKHHC): Secondary | ICD-10-CM

## 2021-03-07 NOTE — Progress Notes (Signed)
Subjective:    Patient ID: Krystal Castillo, female    DOB: March 11, 1966, 55 y.o.   MRN: [id number]  HPI  Pt in for follow up has htn. Last seen about month ago.   Pt bp initially high. Was better on last visit. Pt is on atenolol 100 mg daily and hctz 12.5 mg daily.  Pt states she is very hesitant to take any diabetic meds due fast heart rate side effects. Pt last rx was glyburide.2.5 mg daily. She never took  Lab is closed presently/today.  Hx of high cholesterol. Pt is not on cholesteol medication.    Pt made aware of high risk score so can make educated decision.     The 10-year ASCVD risk score Denman George DC Montez Hageman., et al., 2013) is: 29.9%   Values used to calculate the score:     Age: 83 years     Sex: Female     Is Non-Hispanic African American: Yes     Diabetic: Yes     Tobacco smoker: No     Systolic Blood Pressure: 155 mmHg     Is BP treated: Yes     HDL Cholesterol: 30 mg/dL     Total Cholesterol: 162 mg/dL   Pt dad did have MI.   No palpiations, chest pain recently. Only had in past when took diabetic meds per pt. I have put in referral on last visit.    Review of Systems  Constitutional:  Negative for chills, fatigue and fever.  HENT:  Negative for congestion, drooling and ear pain.   Respiratory:  Negative for cough, chest tightness, shortness of breath and wheezing.   Cardiovascular:  Negative for chest pain and palpitations.  Gastrointestinal:  Negative for abdominal pain.  Musculoskeletal:  Negative for back pain and joint swelling.  Neurological:  Negative for dizziness, seizures, weakness, numbness and headaches.  Hematological:  Negative for adenopathy. Does not bruise/bleed easily.  Psychiatric/Behavioral:  Negative for behavioral problems, decreased concentration, hallucinations and sleep disturbance. The patient is not nervous/anxious.    Past Medical History:  Diagnosis Date   Diabetes mellitus without complication (HCC)    Hypertension      Social  History   Socioeconomic History   Marital status: Single    Spouse name: Not on file   Number of children: Not on file   Years of education: Not on file   Highest education level: Not on file  Occupational History   Not on file  Tobacco Use   Smoking status: Never   Smokeless tobacco: Never  Vaping Use   Vaping Use: Never used  Substance and Sexual Activity   Alcohol use: No   Drug use: No   Sexual activity: Not on file  Other Topics Concern   Not on file  Social History Narrative   Not on file   Social Determinants of Health   Financial Resource Strain: Not on file  Food Insecurity: Not on file  Transportation Needs: Not on file  Physical Activity: Not on file  Stress: Not on file  Social Connections: Not on file  Intimate Partner Violence: Not on file    Past Surgical History:  Procedure Laterality Date   CHOLECYSTECTOMY     FOOT SURGERY     THYROID SURGERY     TONSILLECTOMY     TUBAL LIGATION      No family history on file.  Allergies  Allergen Reactions   Metformin Other (See Comments)    Chest pain Chest  pain    Cefdinir Itching   Doxycycline Palpitations   Glipizide Palpitations    Current Outpatient Medications on File Prior to Visit  Medication Sig Dispense Refill   atenolol (TENORMIN) 100 MG tablet Take by mouth daily.     atenolol (TENORMIN) 100 MG tablet Take 1 tablet by mouth daily.     hydrochlorothiazide (HYDRODIURIL) 12.5 MG tablet 12.5 mg.     hydrochlorothiazide (MICROZIDE) 12.5 MG capsule Take 12.5 mg by mouth daily.     losartan-hydrochlorothiazide (HYZAAR) 100-12.5 MG tablet Take 1 tablet by mouth daily.     No current facility-administered medications on file prior to visit.    BP 130/70   Pulse 79   Resp 18   Ht 5\' 6"  (1.676 m)   Wt 213 lb 9.6 oz (96.9 kg)   SpO2 98%   BMI 34.48 kg/m        Objective:   Physical Exam  General Mental Status- Alert. General Appearance- Not in acute distress.   Skin General:  Color- Normal Color. Moisture- Normal Moisture.  Neck Carotid Arteries- Normal color. Moisture- Normal Moisture. No carotid bruits. No JVD.  Chest and Lung Exam Auscultation: Breath Sounds:-Normal.  Cardiovascular Auscultation:Rythm- Regular. Murmurs & Other Heart Sounds:Auscultation of the heart reveals- No Murmurs.  Abdomen Inspection:-Inspeection Normal. Palpation/Percussion:Note:No mass. Palpation and Percussion of the abdomen reveal- Non Tender, Non Distended + BS, no rebound or guarding.   Neurologic Cranial Nerve exam:- CN III-XII intact(No nystagmus), symmetric smile. Strength:- 5/5 equal and symmetric strength both upper and lower extremities.       Assessment & Plan:   Bp is better controlled. Continue current atenolol and hctz  For diabetes need you to eat low sugar diet and get regular exercise. After you get a1c will need to decide on management. If sugar high would recommend some type of med to lower sugar/reduce cardiovascular risk.  For high cholesterol may advise prescription med as well.  Do recommend that you follow thru with colonoscopy.  Follow up 2 weeks after upcoming labs. Get scheuduled for future cmp, lipid panel and a1c.

## 2021-03-07 NOTE — Patient Instructions (Addendum)
Bp is better controlled. Continue current atenolol and hctz  For diabetes need you to eat low sugar diet and get regular exercise. After you get a1c will need to decide on management. If sugar high would recommend some type of med to lower sugar/reduce cardiovascular risk.  For high cholesterol may advise prescription med as well.  Do recommend that you follow thru with colonoscopy.  Follow up 2 weeks after upcoming labs. Get scheuduled for future cmp, lipid panel and a1c.

## 2021-03-19 ENCOUNTER — Other Ambulatory Visit (INDEPENDENT_AMBULATORY_CARE_PROVIDER_SITE_OTHER): Payer: 59

## 2021-03-19 ENCOUNTER — Other Ambulatory Visit: Payer: Self-pay

## 2021-03-19 DIAGNOSIS — E7849 Other hyperlipidemia: Secondary | ICD-10-CM

## 2021-03-19 DIAGNOSIS — I10 Essential (primary) hypertension: Secondary | ICD-10-CM

## 2021-03-19 DIAGNOSIS — E1100 Type 2 diabetes mellitus with hyperosmolarity without nonketotic hyperglycemic-hyperosmolar coma (NKHHC): Secondary | ICD-10-CM

## 2021-03-19 LAB — COMPREHENSIVE METABOLIC PANEL
ALT: 16 U/L (ref 0–35)
AST: 22 U/L (ref 0–37)
Albumin: 4.2 g/dL (ref 3.5–5.2)
Alkaline Phosphatase: 70 U/L (ref 39–117)
BUN: 18 mg/dL (ref 6–23)
CO2: 28 mEq/L (ref 19–32)
Calcium: 9.6 mg/dL (ref 8.4–10.5)
Chloride: 101 mEq/L (ref 96–112)
Creatinine, Ser: 0.99 mg/dL (ref 0.40–1.20)
GFR: 65.1 mL/min (ref >60.00)
Glucose, Bld: 108 mg/dL — ABNORMAL HIGH (ref 70–99)
Potassium: 3.7 mEq/L (ref 3.5–5.1)
Sodium: 138 mEq/L (ref 135–145)
Total Bilirubin: 0.7 mg/dL (ref 0.2–1.2)
Total Protein: 7.8 g/dL (ref 6.0–8.3)

## 2021-03-19 LAB — LIPID PANEL
Cholesterol: 164 mg/dL (ref 0–200)
HDL: 31.7 mg/dL — ABNORMAL LOW (ref >39.00)
LDL Cholesterol: 110 mg/dL — ABNORMAL HIGH (ref 0–99)
NonHDL: 132.14
Total CHOL/HDL Ratio: 5
Triglycerides: 110 mg/dL (ref 0.0–149.0)
VLDL: 22 mg/dL (ref 0.0–40.0)

## 2021-03-19 LAB — HEMOGLOBIN A1C: Hgb A1c MFr Bld: 7.7 % — ABNORMAL HIGH (ref 4.6–6.5)

## 2021-03-20 MED ORDER — ATORVASTATIN CALCIUM 10 MG PO TABS: 10.0000 mg | ORAL_TABLET | Freq: Every day | ORAL | 3 refills | Status: DC

## 2021-03-20 NOTE — Addendum Note (Signed)
Addended by: Gwenevere Abbot on: 03/20/2021 08:24 AM   Modules accepted: Orders

## 2021-03-26 ENCOUNTER — Telehealth: Payer: Self-pay

## 2021-03-27 NOTE — Telephone Encounter (Signed)
Patient called yesterday afternoon to discuss lab results. I discussed instruction from provider regarding results. The medication that were discontinued in past due to side effects were metformin and glipizide. She states they tried to put her on another medication that started with a G for diabetes but states she couldn't remember the name.   Her question is-- why her glucose  and cholesterol would be so high if she is eating healthy? She states she does not eat anything bad. She states she avoids greasy fatty foods. She is questioning why the atorvastatin is necessary if her cholesterol is not very high.   Please advise. I tried to help the patient the best I could but I was not fully understanding what was asked and how I could explain to her the need for cholesterol medication.

## 2021-03-29 NOTE — Telephone Encounter (Signed)
Called and spoke with patient. She states she has a few more questions and would like call back at 11:30 as she is at work. Will call patient back.

## 2021-03-30 NOTE — Telephone Encounter (Signed)
Left message to return call 

## 2021-04-23 ENCOUNTER — Other Ambulatory Visit: Payer: Self-pay

## 2021-04-23 MED ORDER — HYDROCHLOROTHIAZIDE 12.5 MG PO TABS: 12.5000 mg | ORAL_TABLET | Freq: Every day | ORAL | 1 refills | Status: DC

## 2021-04-23 MED ORDER — HYDROCHLOROTHIAZIDE 12.5 MG PO TABS: 12.5000 mg | ORAL_TABLET | Freq: Every day | ORAL | 1 refills | Status: AC

## 2021-04-23 MED ORDER — ATENOLOL 100 MG PO TABS: 100.0000 mg | ORAL_TABLET | Freq: Every day | ORAL | 1 refills | Status: AC

## 2021-04-23 MED ORDER — ATENOLOL 100 MG PO TABS: 100.0000 mg | ORAL_TABLET | Freq: Every day | ORAL | 1 refills | Status: DC

## 2021-04-23 NOTE — Telephone Encounter (Signed)
Rx refill request of medication that are pended for provider.  Pt is set up for an appointment on 08/07/20 @ 8am.

## 2021-04-23 NOTE — Telephone Encounter (Signed)
Rx atenolol 100 mg  and hctz 12.5 mg  refills.

## 2021-05-07 ENCOUNTER — Telehealth: Payer: Self-pay | Admitting: Medical

## 2021-05-07 NOTE — Telephone Encounter (Signed)
Patient states she got a call about scheduling a colonoscopy, she was not sure if it was our office or not. She also wanted to know about the kit that she can do instead of the colonoscopy. Please advice.

## 2021-05-07 NOTE — Telephone Encounter (Signed)
Do you want pt to go to GI  for colonoscopy or get a colorguard

## 2021-05-08 NOTE — Telephone Encounter (Signed)
Pt called and lvm to return call 

## 2021-05-09 NOTE — Telephone Encounter (Signed)
Pt called back , pt notified and gave her number to GI to call and schedule an appt

## 2021-05-09 NOTE — Telephone Encounter (Signed)
Pt called and lvm to return call 

## 2021-05-17 ENCOUNTER — Telehealth: Payer: Self-pay | Admitting: Medical

## 2021-05-21 ENCOUNTER — Other Ambulatory Visit: Payer: Self-pay

## 2021-05-21 DIAGNOSIS — Z20822 Contact with and (suspected) exposure to covid-19: Secondary | ICD-10-CM | POA: Diagnosis not present

## 2021-05-21 DIAGNOSIS — Z79899 Other long term (current) drug therapy: Secondary | ICD-10-CM | POA: Insufficient documentation

## 2021-05-21 DIAGNOSIS — E119 Type 2 diabetes mellitus without complications: Secondary | ICD-10-CM | POA: Insufficient documentation

## 2021-05-21 DIAGNOSIS — R509 Fever, unspecified: Secondary | ICD-10-CM | POA: Diagnosis present

## 2021-05-21 DIAGNOSIS — R42 Dizziness and giddiness: Secondary | ICD-10-CM | POA: Diagnosis not present

## 2021-05-21 DIAGNOSIS — J4520 Mild intermittent asthma, uncomplicated: Secondary | ICD-10-CM | POA: Insufficient documentation

## 2021-05-21 DIAGNOSIS — I10 Essential (primary) hypertension: Secondary | ICD-10-CM | POA: Insufficient documentation

## 2021-05-21 DIAGNOSIS — J101 Influenza due to other identified influenza virus with other respiratory manifestations: Secondary | ICD-10-CM | POA: Diagnosis not present

## 2021-05-21 LAB — RESP PANEL BY RT-PCR (FLU A&B, COVID) ARPGX2
Influenza A by PCR: POSITIVE — AB
Influenza B by PCR: NEGATIVE
SARS Coronavirus 2 by RT PCR: NEGATIVE

## 2021-05-21 LAB — CBC WITH DIFFERENTIAL/PLATELET
Abs Immature Granulocytes: 0.04 10*3/uL (ref 0.00–0.07)
Basophils Absolute: 0 10*3/uL (ref 0.0–0.1)
Basophils Relative: 0 %
Eosinophils Absolute: 0.1 10*3/uL (ref 0.0–0.5)
Eosinophils Relative: 2 %
HCT: 39.7 % (ref 36.0–46.0)
Hemoglobin: 13.3 g/dL (ref 12.0–15.0)
Immature Granulocytes: 1 %
Lymphocytes Relative: 22 %
Lymphs Abs: 1.2 10*3/uL (ref 0.7–4.0)
MCH: 29.4 pg (ref 26.0–34.0)
MCHC: 33.5 g/dL (ref 30.0–36.0)
MCV: 87.8 fL (ref 80.0–100.0)
Monocytes Absolute: 0.6 10*3/uL (ref 0.1–1.0)
Monocytes Relative: 10 %
Neutro Abs: 3.5 10*3/uL (ref 1.7–7.7)
Neutrophils Relative %: 65 %
Platelets: 128 10*3/uL — ABNORMAL LOW (ref 150–400)
RBC: 4.52 MIL/uL (ref 3.87–5.11)
RDW: 14.5 % (ref 11.5–15.5)
WBC: 5.4 10*3/uL (ref 4.0–10.5)
nRBC: 0 % (ref 0.0–0.2)

## 2021-05-21 LAB — BASIC METABOLIC PANEL
Anion gap: 8 (ref 5–15)
BUN: 15 mg/dL (ref 6–20)
CO2: 28 mmol/L (ref 22–32)
Calcium: 9.1 mg/dL (ref 8.9–10.3)
Chloride: 97 mmol/L — ABNORMAL LOW (ref 98–111)
Creatinine, Ser: 1.13 mg/dL — ABNORMAL HIGH (ref 0.44–1.00)
GFR, Estimated: 58 mL/min — ABNORMAL LOW (ref >60)
Glucose, Bld: 106 mg/dL — ABNORMAL HIGH (ref 70–99)
Potassium: 3.7 mmol/L (ref 3.5–5.1)
Sodium: 133 mmol/L — ABNORMAL LOW (ref 135–145)

## 2021-05-21 MED ORDER — ACETAMINOPHEN 325 MG PO TABS
650.0000 mg | ORAL_TABLET | Freq: Once | ORAL | Status: AC | PRN
  Administered 2021-05-21: 650 mg via ORAL

## 2021-05-21 MED ORDER — ACETAMINOPHEN 325 MG PO TABS
ORAL_TABLET | ORAL | Status: AC
  Filled 2021-05-21: qty 2

## 2021-05-21 NOTE — ED Triage Notes (Signed)
Pt states Saturday night started feeling sick. States today feels lightheaded, fever, diarrhea, cough.

## 2021-05-22 ENCOUNTER — Emergency Department (HOSPITAL_BASED_OUTPATIENT_CLINIC_OR_DEPARTMENT_OTHER)
Admission: EM | Admit: 2021-05-22 | Discharge: 2021-05-22 | Disposition: A | Discharge status: Home / Self Care | Payer: 59 | Attending: Emergency Medicine | Admitting: Emergency Medicine

## 2021-05-22 DIAGNOSIS — J101 Influenza due to other identified influenza virus with other respiratory manifestations: Secondary | ICD-10-CM

## 2021-05-22 MED ORDER — ONDANSETRON 4 MG PO TBDP: 4.0000 mg | ORAL_TABLET | Freq: Three times a day (TID) | ORAL | 0 refills | Status: AC | PRN

## 2021-05-22 MED ORDER — IBUPROFEN 400 MG PO TABS
600.0000 mg | ORAL_TABLET | Freq: Once | ORAL | Status: AC
  Administered 2021-05-22: 600 mg via ORAL
  Filled 2021-05-22: qty 1

## 2021-05-22 NOTE — ED Provider Notes (Signed)
[facility] EMERGENCY DEPARTMENT Provider Note   CSN: [id number] Arrival date & time: 05/21/21  1949     History Chief Complaint  Patient presents with   Dizziness   Fever    Krystal Castillo is a 55 y.o. female.  55 year old female with a history of diabetes, hypertension presents to the emergency department for evaluation of febrile illness.  Reports that she began to have onset of symptoms on Saturday.  Her symptoms have been waxing and waning in severity, but worsening overall.  She notes some body aches as well as dizziness, nausea, cough.  She has not experienced any vomiting, shortness of breath.  States that she was around her granddaughter who was sent home from daycare after a teacher tested positive for the flu.  The history is provided by the patient. No language interpreter was used.  Dizziness Fever     Past Medical History:  Diagnosis Date   Diabetes mellitus without complication (HCC)    Hypertension     Patient Active Problem List   Diagnosis Date Noted   Type 2 diabetes mellitus (HCC) 11/28/2020   Uncontrolled type 2 diabetes mellitus with hyperglycemia, without long-term current use of insulin (HCC) 02/09/2018   Female bladder prolapse 10/15/2017   Benign hypertension 01/02/2013   Perennial allergic rhinitis with seasonal variation 01/02/2013   Mild intermittent asthma without complication 01/02/2013    Past Surgical History:  Procedure Laterality Date   CHOLECYSTECTOMY     FOOT SURGERY     THYROID SURGERY     TONSILLECTOMY     TUBAL LIGATION       OB History   No obstetric history on file.     No family history on file.  Social History   Tobacco Use   Smoking status: Never   Smokeless tobacco: Never  Vaping Use   Vaping Use: Never used  Substance Use Topics   Alcohol use: No   Drug use: No    Home Medications Prior to Admission medications   Medication Sig Start Date End Date Taking? Authorizing Provider  ondansetron  (ZOFRAN ODT) 4 MG disintegrating tablet Take 1 tablet (4 mg total) by mouth every 8 (eight) hours as needed for nausea or vomiting. 05/22/21  Yes Antony Madura, PA-C  atenolol (TENORMIN) 100 MG tablet Take 1 tablet (100 mg total) by mouth daily. 04/23/21   Saguier, Ramon Dredge, PA-C  atorvastatin (LIPITOR) 10 MG tablet Take 1 tablet (10 mg total) by mouth daily. 03/20/21   Saguier, Ramon Dredge, PA-C  hydrochlorothiazide (HYDRODIURIL) 12.5 MG tablet Take 1 tablet (12.5 mg total) by mouth daily. 04/23/21   Saguier, Ramon Dredge, PA-C  hydrochlorothiazide (MICROZIDE) 12.5 MG capsule Take 12.5 mg by mouth daily.    [provider]    Allergies    Metformin, Cefdinir, Doxycycline, and Glipizide  Review of Systems   Review of Systems  Constitutional:  Positive for fever.  Neurological:  Positive for dizziness.  Ten systems reviewed and are negative for acute change, except as noted in the HPI.    Physical Exam Updated Vital Signs BP 119/65 (BP Location: Left Arm)   Pulse 89   Temp 100.3 F (37.9 C) (Oral)   Resp 16   Ht 5\' 7"  (1.702 m)   Wt 133.9 kg   SpO2 96%   BMI 46.22 kg/m   Physical Exam Vitals and nursing note reviewed.  Constitutional:      General: She is not in acute distress.    Appearance: She is well-developed.  She is not diaphoretic.     Comments: Nontoxic-appearing female.  HENT:     Head: Normocephalic and atraumatic.  Eyes:     General: No scleral icterus.    Conjunctiva/sclera: Conjunctivae normal.  Cardiovascular:     Comments: Not tachycardic as noted in triage Pulmonary:     Effort: Pulmonary effort is normal. No respiratory distress.     Breath sounds: No wheezing, rhonchi or rales.     Comments: Lungs grossly clear to auscultation bilaterally.  Respirations even, unlabored. Musculoskeletal:        General: Normal range of motion.     Cervical back: Normal range of motion.  Skin:    General: Skin is warm and dry.     Coloration: Skin is not pale.     Findings:  No erythema or rash.  Neurological:     Mental Status: She is alert and oriented to person, place, and time.     Coordination: Coordination normal.  Psychiatric:        Behavior: Behavior normal.    ED Results / Procedures / Treatments   Labs (all labs ordered are listed, but only abnormal results are displayed) Labs Reviewed  RESP PANEL BY RT-PCR (FLU A&B, COVID) ARPGX2 - Abnormal; Notable for the following components:      Result Value   Influenza A by PCR POSITIVE (*)    All other components within normal limits  CBC WITH DIFFERENTIAL/PLATELET - Abnormal; Notable for the following components:   Platelets 128 (*)    All other components within normal limits  BASIC METABOLIC PANEL - Abnormal; Notable for the following components:   Sodium 133 (*)    Chloride 97 (*)    Glucose, Bld 106 (*)    Creatinine, Ser 1.13 (*)    GFR, Estimated 58 (*)    All other components within normal limits    EKG None  Radiology No results found.  Procedures Procedures   Medications Ordered in ED Medications  acetaminophen (TYLENOL) tablet 650 mg ( Oral Canceled Entry 05/21/21 2018)  ibuprofen (ADVIL) tablet 600 mg (600 mg Oral Given 05/22/21 0220)    ED Course  I have reviewed the triage vital signs and the nursing notes.  Pertinent labs & imaging results that were available during my care of the patient were reviewed by me and considered in my medical decision making (see chart for details).    MDM Rules/Calculators/A&P                           55 year old female presents to the emergency department for symptoms consistent with viral illness.  She has tested positive for influenza A in the emergency department.  Initially was febrile, tachycardic.  Fever has defervesced with antipyretics.  Tachycardia has resolved.  She clinically appears well, nontoxic.  Is not experiencing any shortness of breath.  Has not been hypoxic.  No tachypnea.  Lungs are grossly clear.  Have advised  continued outpatient supportive care as well as close primary care follow-up.  Encouraged rest, fluid hydration.  Return precautions discussed and provided. Patient discharged in stable condition with no unaddressed concerns.   Final Clinical Impression(s) / ED Diagnoses Final diagnoses:  Influenza A    Rx / DC Orders ED Discharge Orders          Ordered    ondansetron (ZOFRAN ODT) 4 MG disintegrating tablet  Every 8 hours PRN  05/22/21 0219             Antony Madura, PA-C 05/22/21 3614    Eber Hong, MD 06/02/21 [phone]

## 2021-05-22 NOTE — Discharge Instructions (Signed)
We recommend 650 mg Tylenol every 4-6 hours for fever.  You may take this with ibuprofen for management of persistent headache, body aches.  You have been provided a prescription for Zofran to take for management of nausea.  Be sure to drink plenty of fluids to prevent dehydration.  You may use over-the-counter medications for additional symptom control.  Coricidin HBP is specifically useful in patients with high blood pressure.  Follow-up with your doctor to ensure resolution of symptoms.  Return to the ED if symptoms persist or worsen.

## 2021-05-22 NOTE — ED Notes (Signed)
Discharge instructions discussed with pt. Pt verbalized understanding. Pt stable and ambulatory.  °

## 2021-05-23 ENCOUNTER — Telehealth: Payer: Self-pay | Admitting: Medical

## 2021-05-23 NOTE — Telephone Encounter (Signed)
Error

## 2021-05-23 NOTE — Telephone Encounter (Signed)
Pt. Called in and stated she was seen in the ER on 10/25 and they stated she has the flu. She is wanting to see can she get an albuterol inhaler prescribed due to having to use it in the past with similar respiratory situations.

## 2021-05-24 ENCOUNTER — Telehealth: Payer: Self-pay | Admitting: Medical

## 2021-05-24 MED ORDER — ALBUTEROL SULFATE HFA 108 (90 BASE) MCG/ACT IN AERS: 2.0000 | INHALATION_SPRAY | Freq: Four times a day (QID) | RESPIRATORY_TRACT | 0 refills | Status: AC | PRN

## 2021-05-24 NOTE — Telephone Encounter (Signed)
Chart opened to rx med, order lab, review chart, respond to my chart message or send message to staff member  

## 2021-05-24 NOTE — Telephone Encounter (Signed)
Pt.notified

## 2021-08-07 ENCOUNTER — Ambulatory Visit: Payer: 59 | Admitting: Medical

## 2021-08-08 ENCOUNTER — Ambulatory Visit: Payer: 59 | Admitting: Medical

## 2021-08-20 ENCOUNTER — Other Ambulatory Visit: Payer: Self-pay | Admitting: Medical

## 2022-01-14 IMAGING — CT CT ABD-PELV W/ CM
2 of 5 series · 16 of 46 positions shown, 18 images · IV contrast (Omnipaque)
Comparison: None.

CLINICAL DATA: Acute right lower quadrant abdominal pain.

EXAM:
CT ABDOMEN AND PELVIS WITH CONTRAST
TECHNIQUE: Multidetector CT imaging of the abdomen and pelvis was performed
using the standard protocol following bolus administration of
intravenous contrast.
CONTRAST:  100mL OMNIPAQUE IOHEXOL 300 MG/ML  SOLN

[Series 2: axial st · axial · 0.83mm/px · z∈[-478,-72]mm · 13 of 91 slices shown, 15 images]
[im 5/91  soft-tissue]
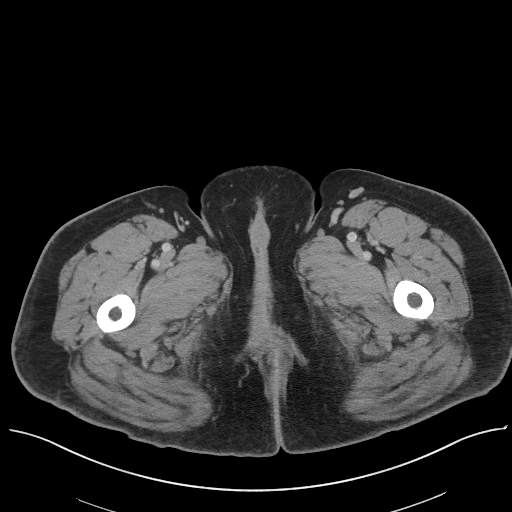
[im 5/91  bone]
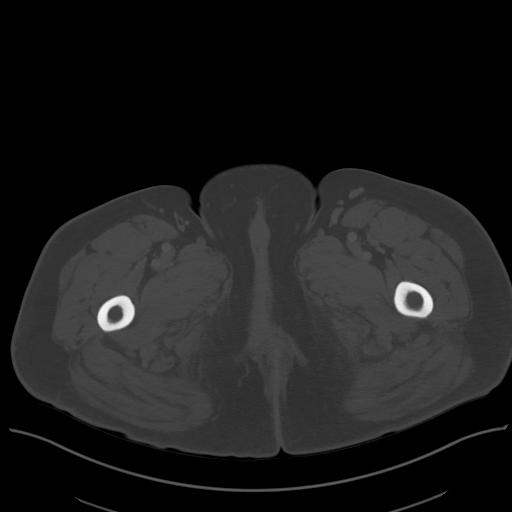
[im 15/91  soft-tissue]
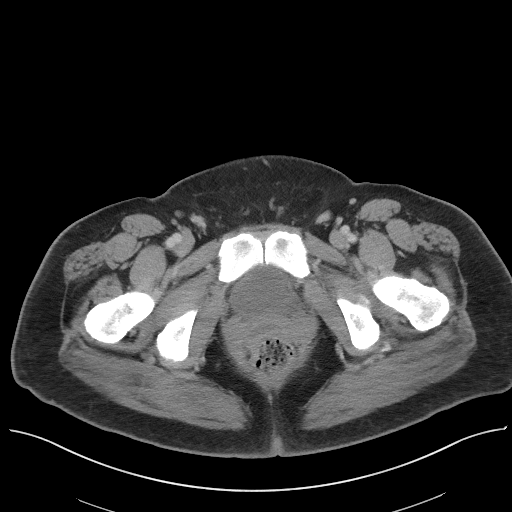
[im 19/91  soft-tissue]
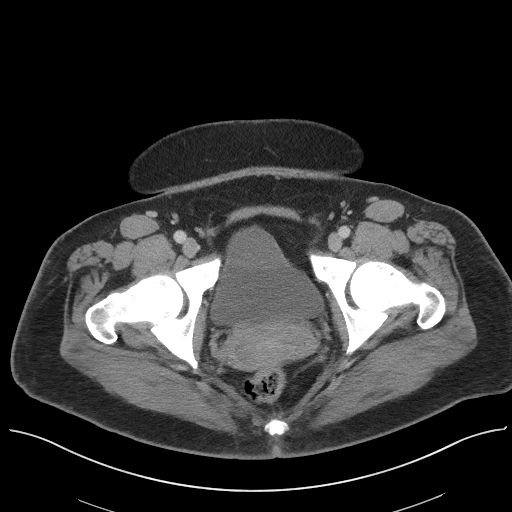
[im 24/91  soft-tissue]
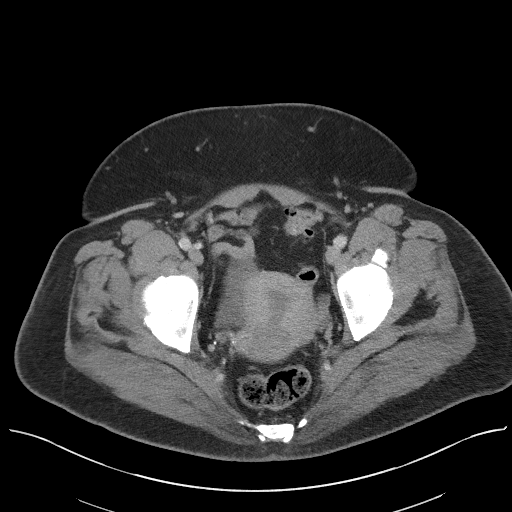
[im 34/91  soft-tissue]
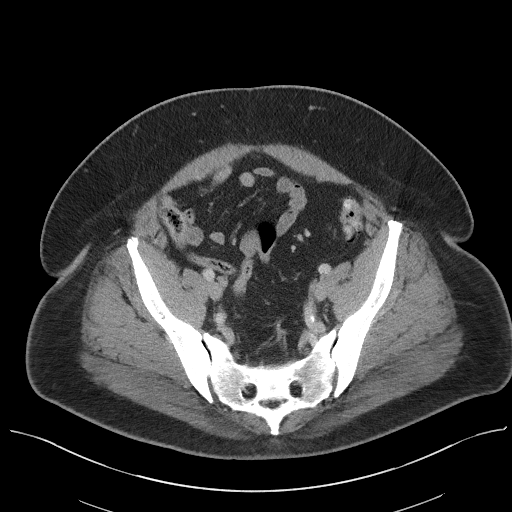
[im 38/91  soft-tissue]
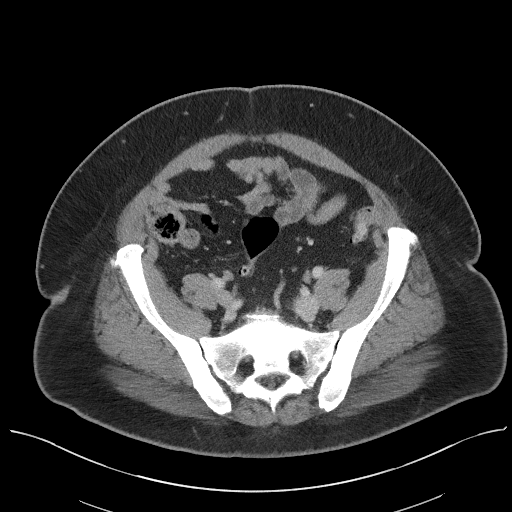
[im 48/91  soft-tissue]
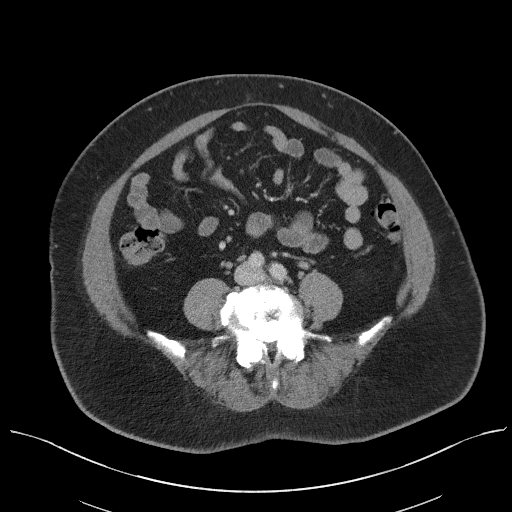
[im 53/91  soft-tissue]
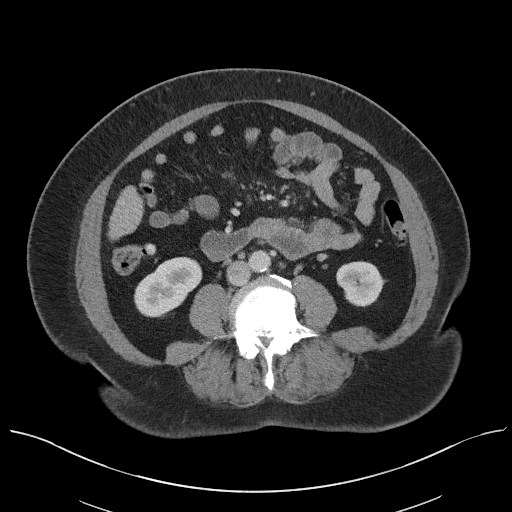
[im 57/91  soft-tissue]
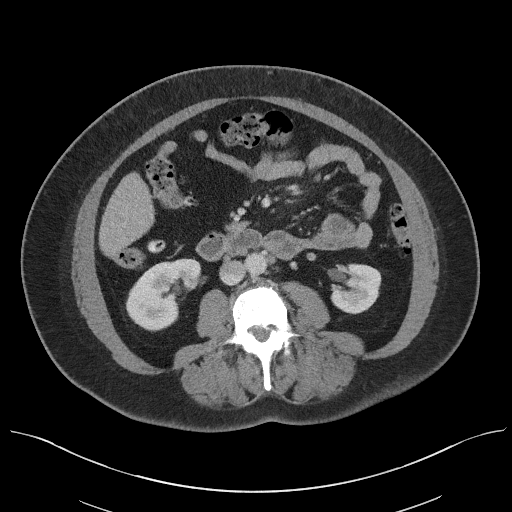
[im 57/91  bone]
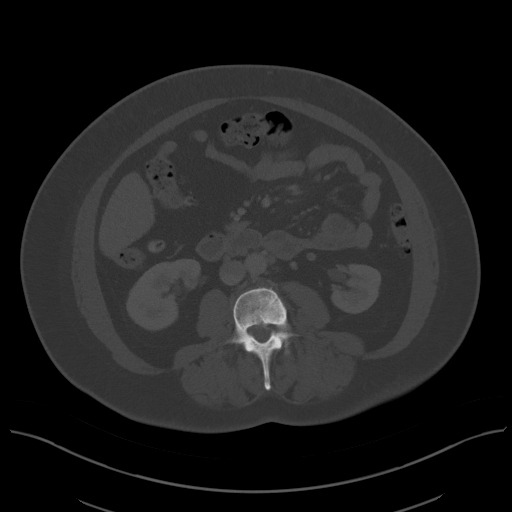
[im 67/91  soft-tissue]
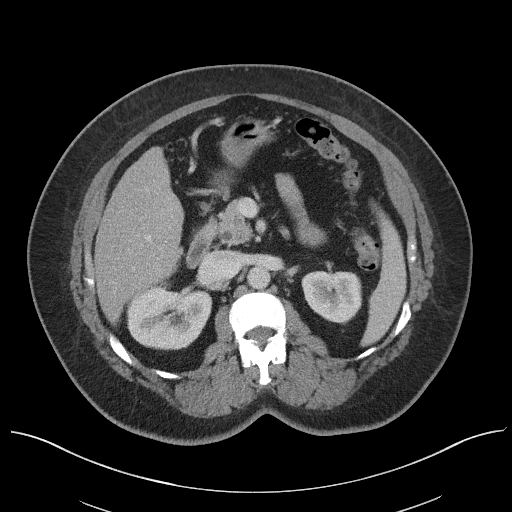
[im 72/91  soft-tissue]
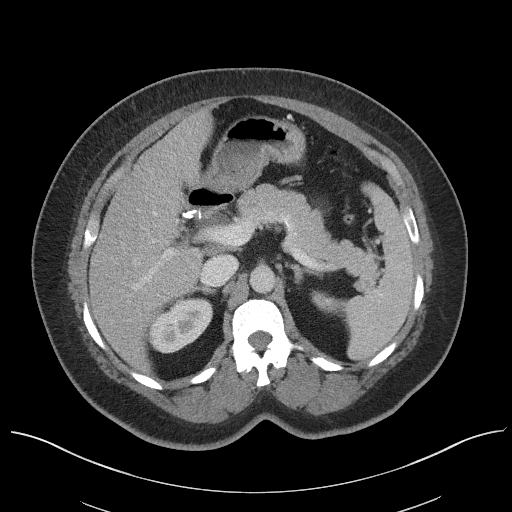
[im 76/91  soft-tissue]
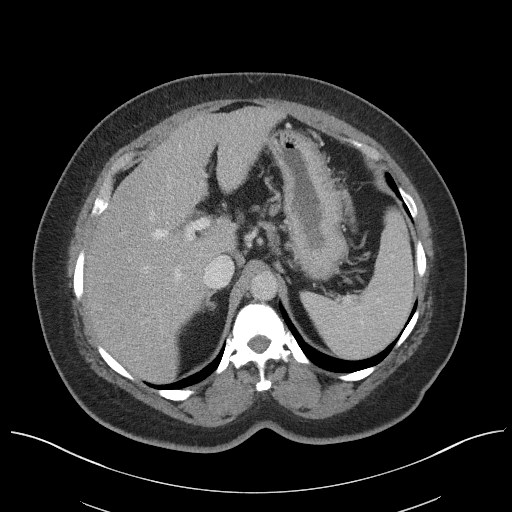
[im 86/91  soft-tissue]
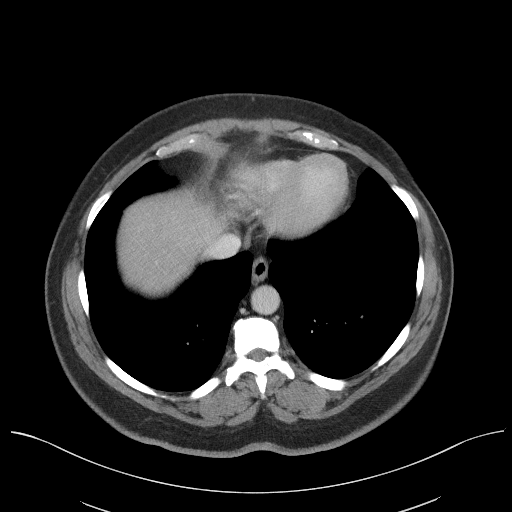

[Series 5: coronal st · coronal · 0.79mm/px · 3 of 108 slices shown]
[im 36/108  soft-tissue]
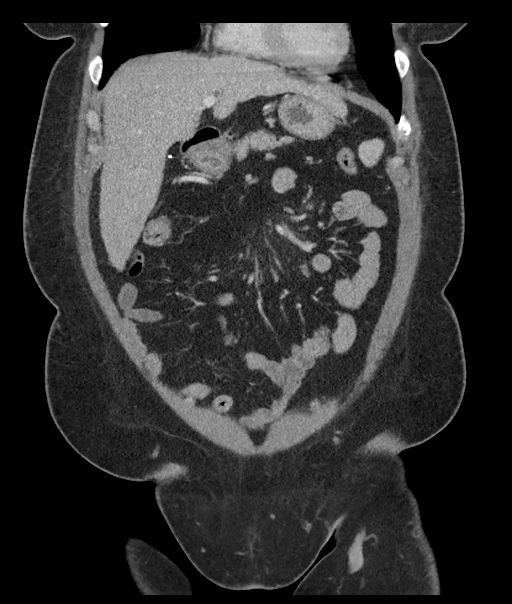
[im 48/108  soft-tissue]
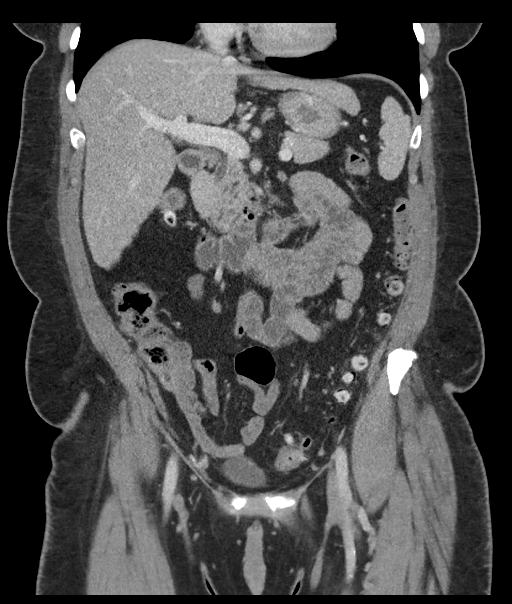
[im 60/108  soft-tissue]
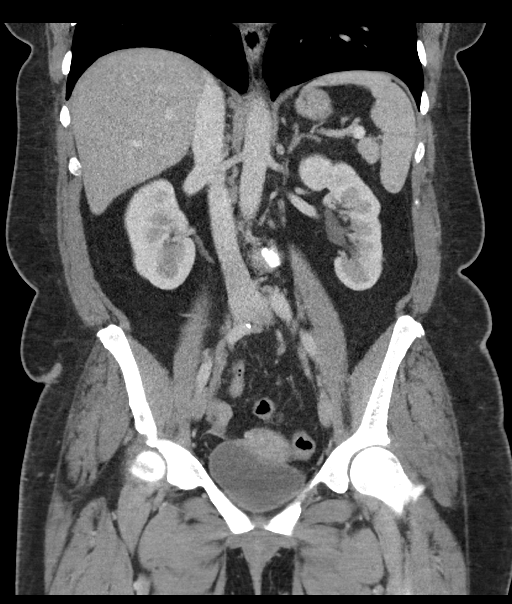

[16 of 46 positions shown; findings below may reference images not displayed]

FINDINGS: Lower chest: No acute abnormality.

Hepatobiliary: Status post cholecystectomy. No biliary dilatation is
noted. Hepatic cirrhosis is noted. No definite hepatic abnormality
is noted.

Pancreas: Unremarkable. No pancreatic ductal dilatation or
surrounding inflammatory changes.

Spleen: Normal in size without focal abnormality.

Adrenals/Urinary Tract: Adrenal glands appear normal. Left renal
atrophy is noted. Small cyst is seen in lower pole of left kidney.
No hydronephrosis or renal obstruction is noted. Urinary bladder is
unremarkable.

Stomach/Bowel: Stomach is within normal limits. Appendix appears
normal. No evidence of bowel wall thickening, distention, or
inflammatory changes. Diverticulosis of proximal sigmoid colon is
noted without inflammation.

Vascular/Lymphatic: No significant vascular findings are present. No
enlarged abdominal or pelvic lymph nodes.

Reproductive: Uterus and bilateral adnexa are unremarkable.

Other: No abdominal wall hernia or abnormality. No abdominopelvic
ascites.

Musculoskeletal: Multilevel degenerative disc disease is noted in
the lower lumbar spine. No acute osseous abnormality is noted.
IMPRESSION: 1. Hepatic cirrhosis.
2. Left renal atrophy.
3. Diverticulosis of proximal sigmoid colon without inflammation.
4. No acute abnormality seen in the abdomen or pelvis.

## 2022-02-23 ENCOUNTER — Other Ambulatory Visit: Payer: Self-pay

## 2022-02-23 ENCOUNTER — Encounter (HOSPITAL_BASED_OUTPATIENT_CLINIC_OR_DEPARTMENT_OTHER): Payer: Self-pay | Admitting: Emergency Medicine

## 2022-02-23 DIAGNOSIS — Z79899 Other long term (current) drug therapy: Secondary | ICD-10-CM | POA: Insufficient documentation

## 2022-02-23 DIAGNOSIS — I10 Essential (primary) hypertension: Secondary | ICD-10-CM | POA: Diagnosis not present

## 2022-02-23 DIAGNOSIS — E119 Type 2 diabetes mellitus without complications: Secondary | ICD-10-CM | POA: Insufficient documentation

## 2022-02-23 DIAGNOSIS — G5711 Meralgia paresthetica, right lower limb: Secondary | ICD-10-CM | POA: Insufficient documentation

## 2022-02-23 DIAGNOSIS — M7989 Other specified soft tissue disorders: Secondary | ICD-10-CM | POA: Insufficient documentation

## 2022-02-23 NOTE — ED Triage Notes (Signed)
Swelling to R leg near groin. Pt states it burns to touch it. Area was first noticed earlier today. No redness noted, skin intact. Denies fevers or other sx.

## 2022-02-24 ENCOUNTER — Ambulatory Visit (HOSPITAL_BASED_OUTPATIENT_CLINIC_OR_DEPARTMENT_OTHER)
Admission: RE | Admit: 2022-02-24 | Discharge: 2022-02-24 | Disposition: A | Discharge status: Home / Self Care | Payer: PRIVATE HEALTH INSURANCE | Source: Ambulatory Visit | Attending: Emergency Medicine | Admitting: Emergency Medicine

## 2022-02-24 ENCOUNTER — Emergency Department (HOSPITAL_BASED_OUTPATIENT_CLINIC_OR_DEPARTMENT_OTHER)
Admission: EM | Admit: 2022-02-24 | Discharge: 2022-02-24 | Disposition: A | Discharge status: Home / Self Care | Payer: PRIVATE HEALTH INSURANCE | Attending: Emergency Medicine | Admitting: Emergency Medicine

## 2022-02-24 DIAGNOSIS — M7989 Other specified soft tissue disorders: Secondary | ICD-10-CM | POA: Insufficient documentation

## 2022-02-24 DIAGNOSIS — G5711 Meralgia paresthetica, right lower limb: Secondary | ICD-10-CM

## 2022-02-24 NOTE — ED Provider Notes (Signed)
[facility] EMERGENCY DEPARTMENT Provider Note   CSN: [id number] Arrival date & time: 02/23/22  2341     History  Chief Complaint  Patient presents with   Leg Swelling    Krystal Castillo is a 56 y.o. female.  HPI Krystal Castillo is a 56 y.o. female who presents to the Emergency Department complaining of leg pain.  She presents to the emergency department for evaluation of burning to the lateral aspect of her right leg that started earlier today she states that the pain wraps around to her right lower inner thigh and sometimes goes down her leg.  It is described as a burning sensation that is only present when she touches her skin.  No pain when she does not touch it.  She feels like the leg might be swollen.  No associated fevers, back pain, shortness of breath, abdominal pain, urinary symptoms.  No injuries.  No numbness, weakness.  She does wear a girdle.  She has not had chickenpox or chickenpox vaccine.  She has a history of hypertension, diet-controlled diabetes.  No prior similar symptoms.    Home Medications Prior to Admission medications   Medication Sig Start Date End Date Taking? Authorizing Provider  albuterol (VENTOLIN HFA) 108 (90 Base) MCG/ACT inhaler Inhale 2 puffs into the lungs every 6 (six) hours as needed. 05/24/21   Saguier, Ramon Dredge, PA-C  atenolol (TENORMIN) 100 MG tablet Take 1 tablet (100 mg total) by mouth daily. 04/23/21   Saguier, Ramon Dredge, PA-C  atorvastatin (LIPITOR) 10 MG tablet Take 1 tablet by mouth once daily 08/21/21   Saguier, Ramon Dredge, PA-C  hydrochlorothiazide (HYDRODIURIL) 12.5 MG tablet Take 1 tablet (12.5 mg total) by mouth daily. 04/23/21   Saguier, Ramon Dredge, PA-C  hydrochlorothiazide (MICROZIDE) 12.5 MG capsule Take 12.5 mg by mouth daily.    [provider]  ondansetron (ZOFRAN ODT) 4 MG disintegrating tablet Take 1 tablet (4 mg total) by mouth every 8 (eight) hours as needed for nausea or vomiting. 05/22/21   Antony Madura, PA-C       Allergies    Metformin, Cefdinir, Doxycycline, and Glipizide    Review of Systems   Review of Systems  All other systems reviewed and are negative.   Physical Exam Updated Vital Signs BP (!) 142/77 (BP Location: Right Arm)   Pulse 63   Temp 98.2 F (36.8 C)   Resp 18   Ht 5\' 6"  (1.676 m)   Wt 98 kg   SpO2 99%   BMI 34.86 kg/m  Physical Exam Vitals and nursing note reviewed.  Constitutional:      Appearance: She is well-developed.  HENT:     Head: Normocephalic and atraumatic.  Cardiovascular:     Rate and Rhythm: Normal rate and regular rhythm.     Heart sounds: No murmur heard. Pulmonary:     Effort: Pulmonary effort is normal. No respiratory distress.     Breath sounds: Normal breath sounds.  Abdominal:     Palpations: Abdomen is soft.     Tenderness: There is no abdominal tenderness. There is no guarding or rebound.  Musculoskeletal:        General: No swelling or tenderness.     Comments: 2+ femoral and DP pulses bilaterally.  No significant soft tissue swelling to bilateral lower extremities.  No rashes to bilateral lower extremities.  No palpable inguinal lymphadenopathy.  Skin:    General: Skin is warm and dry.  Neurological:     Mental Status: She is alert  and oriented to person, place, and time.     Comments: 5 out of 5 strength in bilateral lower extremities in proximal and distal muscle groups with sensation to light touch intact in bilateral lower extremities  Psychiatric:        Behavior: Behavior normal.     ED Results / Procedures / Treatments   Labs (all labs ordered are listed, but only abnormal results are displayed) Labs Reviewed - No data to display  EKG None  Radiology No results found.  Procedures Procedures    Medications Ordered in ED Medications - No data to display  ED Course/ Medical Decision Making/ A&P                           Medical Decision Making  Patient here for evaluation of burning pain to the right  thigh.  No appreciable skin changes on examination.  No rash or evidence of shingles, unlikely and patient without history of chickenpox or chickenpox vaccine.  She is neurologically intact on evaluation with no focal deficits.  No evidence of cellulitis or soft tissue infection.  Current presentation is not consistent with dissection, cauda equina.  Discussed with patient unclear source of symptoms, given that she wears a girdle question if this is meralgia paresthetica secondary to extrinsic nerve compression.  Recommend discontinuing the girdle at this point in time.  Given subjective sensation of increased swelling to the leg recommend that she have a ultrasound to rule out DVT, will need to return later today to have the study completed.  Discussed outpatient follow-up and return precautions.        Final Clinical Impression(s) / ED Diagnoses Final diagnoses:  Meralgia paresthetica of right side  Leg swelling    Rx / DC Orders ED Discharge Orders          Ordered    US Venous Img Lower Unilateral Right        02/24/22 0203              Tilden Fossa, MD 02/24/22 [phone]

## 2022-02-24 NOTE — Discharge Instructions (Addendum)
Please return later today to have the ultrasound of your leg.  You may take Tylenol or ibuprofen available over-the-counter according to label instructions as needed for pain.  Stop using a girdle because this might be causing some of your symptoms.  Get rechecked if you have new numbness, weakness, fevers or new concerning symptoms.

## 2022-10-05 ENCOUNTER — Other Ambulatory Visit: Payer: Self-pay

## 2022-10-05 ENCOUNTER — Encounter (HOSPITAL_BASED_OUTPATIENT_CLINIC_OR_DEPARTMENT_OTHER): Payer: Self-pay

## 2022-10-05 ENCOUNTER — Emergency Department (HOSPITAL_BASED_OUTPATIENT_CLINIC_OR_DEPARTMENT_OTHER)
Admission: EM | Admit: 2022-10-05 | Discharge: 2022-10-05 | Disposition: A | Discharge status: Home / Self Care | Payer: PRIVATE HEALTH INSURANCE | Attending: Emergency Medicine | Admitting: Emergency Medicine

## 2022-10-05 DIAGNOSIS — J4520 Mild intermittent asthma, uncomplicated: Secondary | ICD-10-CM | POA: Insufficient documentation

## 2022-10-05 DIAGNOSIS — E119 Type 2 diabetes mellitus without complications: Secondary | ICD-10-CM | POA: Diagnosis not present

## 2022-10-05 DIAGNOSIS — Z79899 Other long term (current) drug therapy: Secondary | ICD-10-CM | POA: Insufficient documentation

## 2022-10-05 DIAGNOSIS — R109 Unspecified abdominal pain: Secondary | ICD-10-CM | POA: Insufficient documentation

## 2022-10-05 DIAGNOSIS — I10 Essential (primary) hypertension: Secondary | ICD-10-CM | POA: Insufficient documentation

## 2022-10-05 DIAGNOSIS — T3695XA Adverse effect of unspecified systemic antibiotic, initial encounter: Secondary | ICD-10-CM | POA: Diagnosis not present

## 2022-10-05 DIAGNOSIS — T887XXA Unspecified adverse effect of drug or medicament, initial encounter: Secondary | ICD-10-CM

## 2022-10-05 LAB — BASIC METABOLIC PANEL
Anion gap: 6 (ref 5–15)
BUN: 18 mg/dL (ref 6–20)
CO2: 27 mmol/L (ref 22–32)
Calcium: 9.1 mg/dL (ref 8.9–10.3)
Chloride: 102 mmol/L (ref 98–111)
Creatinine, Ser: 0.84 mg/dL (ref 0.44–1.00)
GFR, Estimated: >60 mL/min (ref >60)
Glucose, Bld: 153 mg/dL — ABNORMAL HIGH (ref 70–99)
Potassium: 3.4 mmol/L — ABNORMAL LOW (ref 3.5–5.1)
Sodium: 135 mmol/L (ref 135–145)

## 2022-10-05 LAB — CBC WITH DIFFERENTIAL/PLATELET
Abs Immature Granulocytes: 0.01 10*3/uL (ref 0.00–0.07)
Basophils Absolute: 0 10*3/uL (ref 0.0–0.1)
Basophils Relative: 0 %
Eosinophils Absolute: 0.2 10*3/uL (ref 0.0–0.5)
Eosinophils Relative: 3 %
HCT: 39 % (ref 36.0–46.0)
Hemoglobin: 13.5 g/dL (ref 12.0–15.0)
Immature Granulocytes: 0 %
Lymphocytes Relative: 35 %
Lymphs Abs: 2.4 10*3/uL (ref 0.7–4.0)
MCH: 29.3 pg (ref 26.0–34.0)
MCHC: 34.6 g/dL (ref 30.0–36.0)
MCV: 84.6 fL (ref 80.0–100.0)
Monocytes Absolute: 0.4 10*3/uL (ref 0.1–1.0)
Monocytes Relative: 6 %
Neutro Abs: 3.8 10*3/uL (ref 1.7–7.7)
Neutrophils Relative %: 56 %
Platelets: 163 10*3/uL (ref 150–400)
RBC: 4.61 MIL/uL (ref 3.87–5.11)
RDW: 13.3 % (ref 11.5–15.5)
WBC: 6.8 10*3/uL (ref 4.0–10.5)
nRBC: 0 % (ref 0.0–0.2)

## 2022-10-05 NOTE — Discharge Instructions (Addendum)
We evaluated you for your symptoms in the emergency department.  Your lab tests were reassuring.  Your physical exam was also normal.  We did not see any signs of a severe allergic reaction.  It is possible that your symptoms may be due to a mild reaction to Levaquin so I think it is a good idea to discontinue this medication and see if your symptoms improve.  Please take the Augmentin that was prescribed for your sinus infection.  Please follow-up closely with your primary doctor or schedule an appointment with a new primary doctor.  Please return if you develop any new or worsening symptoms such as worsening or severe muscle aches, color change of your urine, lightheadedness or dizziness, new rashes, or any other worsening symptoms.

## 2022-10-05 NOTE — ED Provider Notes (Signed)
[place] Provider Note  CSN: [id number] Arrival date & time: 10/05/22 1504  Chief Complaint(s) Medication Reaction  HPI Krystal Castillo is a 57 y.o. female history of diabetes, hypertension presenting to the emergency department with concern for medication reaction.  Patient reports that for the past few weeks she has had runny nose, facial pressure, sore throat.  She reports chronic sinus infections, has been treated with Augmentin in the past, her primary doctor prescribed her Levaquin this time.  She reports taking 4 days of Levaquin.  She reports that for the past 3 to 4 days she has had some bodyaches, mild burning stomach pain, feeling tired.  She also reports that she feels that her hands are swollen.  She is concerned that this could be due to Levaquin.  She reports that she was prescribed Augmentin by a different physician and stopped taking the Levaquin.  No nausea, vomiting, no fevers or chills, no chest pain or shortness of breath, no lightheadedness or dizziness.  She reports maybe she had a rash but it is gone now.   Past Medical History Past Medical History:  Diagnosis Date   Diabetes mellitus without complication ([place])    Hypertension    Patient Active Problem List   Diagnosis Date Noted   Type 2 diabetes mellitus ([place]) 11/28/2020   Uncontrolled type 2 diabetes mellitus with hyperglycemia, without long-term current use of insulin ([place]) 02/09/2018   Female bladder prolapse 10/15/2017   Benign hypertension 01/02/2013   Perennial allergic rhinitis with seasonal variation 01/02/2013   Mild intermittent asthma without complication [id number]   Home Medication(s) Prior to Admission medications   Medication Sig Start Date End Date Taking? Authorizing Provider  albuterol (VENTOLIN HFA) 108 (90 Base) MCG/ACT inhaler Inhale 2 puffs into the lungs every 6 (six) hours as needed. 05/24/21   Saguier, Percell Miller, PA-C  atenolol (TENORMIN) 100  MG tablet Take 1 tablet (100 mg total) by mouth daily. 04/23/21   Saguier, Percell Miller, PA-C  atorvastatin (LIPITOR) 10 MG tablet Take 1 tablet by mouth once daily 08/21/21   Saguier, Percell Miller, PA-C  hydrochlorothiazide (HYDRODIURIL) 12.5 MG tablet Take 1 tablet (12.5 mg total) by mouth daily. 04/23/21   Saguier, Percell Miller, PA-C  hydrochlorothiazide (MICROZIDE) 12.5 MG capsule Take 12.5 mg by mouth daily.    [provider]  ondansetron (ZOFRAN ODT) 4 MG disintegrating tablet Take 1 tablet (4 mg total) by mouth every 8 (eight) hours as needed for nausea or vomiting. 05/22/21   Antonietta Breach, PA-C                                                                                                                                    Past Surgical History Past Surgical History:  Procedure Laterality Date   CHOLECYSTECTOMY     FOOT SURGERY     THYROID SURGERY     TONSILLECTOMY     TUBAL  LIGATION     Family History History reviewed. No pertinent family history.  Social History Social History   Tobacco Use   Smoking status: Never   Smokeless tobacco: Never  Vaping Use   Vaping Use: Never used  Substance Use Topics   Alcohol use: No   Drug use: No   Allergies Metformin, Cefdinir, Shellfish allergy, Doxycycline, and Glipizide  Review of Systems Review of Systems  All other systems reviewed and are negative.   Physical Exam Vital Signs  I have reviewed the triage vital signs BP (!) 163/107 (BP Location: Right Arm)   Pulse 84   Temp 99 F (37.2 C) (Oral)   Resp 16   Ht '5\' 6"'$  (1.676 m)   Wt 96.2 kg   SpO2 100%   BMI 34.22 kg/m  Physical Exam Vitals and nursing note reviewed.  Constitutional:      General: She is not in acute distress.    Appearance: She is well-developed.  HENT:     Head: Normocephalic and atraumatic.     Mouth/Throat:     Mouth: Mucous membranes are moist.  Eyes:     Pupils: Pupils are equal, round, and reactive to light.  Cardiovascular:     Rate and  Rhythm: Normal rate and regular rhythm.     Heart sounds: No murmur heard. Pulmonary:     Effort: Pulmonary effort is normal. No respiratory distress.     Breath sounds: Normal breath sounds.  Abdominal:     General: Abdomen is flat.     Palpations: Abdomen is soft.     Tenderness: There is no abdominal tenderness.  Musculoskeletal:        General: No tenderness.     Right lower leg: No edema.     Left lower leg: No edema.  Skin:    General: Skin is warm and dry.  Neurological:     General: No focal deficit present.     Mental Status: She is alert. Mental status is at baseline.  Psychiatric:        Mood and Affect: Mood normal.        Behavior: Behavior normal.     ED Results and Treatments Labs (all labs ordered are listed, but only abnormal results are displayed) Labs Reviewed  BASIC METABOLIC PANEL - Abnormal; Notable for the following components:      Result Value   Potassium 3.4 (*)    Glucose, Bld 153 (*)    All other components within normal limits  CBC WITH DIFFERENTIAL/PLATELET                                                                                                                          Radiology No results found.  Pertinent labs & imaging results that were available during my care of the patient were reviewed by me and considered in my medical decision making (see MDM for details).  Medications Ordered in ED Medications - No data to  display                                                                                                                                   Procedures Procedures  (including critical care time)  Medical Decision Making / ED Course   MDM:  57 year old female presenting to the emergency department with concern for medication reaction.  Patient overall well-appearing.  No evidence of anaphylactic reaction.  No angioedema, no rashes or urticaria, normal blood pressure, no nausea vomiting or diarrhea.  Doubt severe  medication reaction such as DRESS, SJS/TEN, no rashes present.  Doubt rhabdomyolysis, patient reports some sensation of myalgias but no muscular tenderness on exam renal function normal, denies urinary changes. Could be very mild rhabdomyolysis but doubt CK would add much given no report of dark urine and normal renal function.No objective swelling or rashes noted on exam despite patient report of swollen hands.  Patient denies any fevers or chills to suggest URI.  Patient reports her symptoms all began after initiation of medication.  It is possible could be a minor medication reaction and patient has self discontinued this medication and is already been prescribed appropriate treatment for sinusitis.  Advised to follow-up with primary physician given overall reassuring workup and physical exam.  Discussed return precautions such as severe muscle aches, change in color in urine, nausea or vomiting, rashes, or any other new symptoms. Will discharge patient to home. All questions answered. Patient comfortable with plan of discharge. Return precautions discussed with patient and specified on the after visit summary.       Additional history obtained: -Additional history obtained from family -External records from outside source obtained and reviewed including: Chart review including previous notes, labs, imaging, consultation notes including PMD notes regarding treatment of recent sinus infection   Lab Tests: -I ordered, reviewed, and interpreted labs.   The pertinent results include:   Labs Reviewed  BASIC METABOLIC PANEL - Abnormal; Notable for the following components:      Result Value   Potassium 3.4 (*)    Glucose, Bld 153 (*)    All other components within normal limits  CBC WITH DIFFERENTIAL/PLATELET    Notable for borderline hypokalemia, normal renal function   Medicines ordered and prescription drug management: No orders of the defined types were placed in this encounter.   -I  have reviewed the patients home medicines and have made adjustments as needed  Social Determinants of Health:  Diagnosis or treatment significantly limited by social determinants of health: obesity   Co morbidities that complicate the patient evaluation  Past Medical History:  Diagnosis Date   Diabetes mellitus without complication ([place])    Hypertension       Dispostion: Disposition decision including need for hospitalization was considered, and patient discharged from emergency department.    Final Clinical Impression(s) / ED Diagnoses Final diagnoses:  Medication side effect     This chart was  dictated using voice recognition software.  Despite best efforts to proofread,  errors can occur which can change the documentation meaning.    Cristie Hem, MD 10/05/22 [phone]

## 2022-10-05 NOTE — ED Triage Notes (Addendum)
Pt here for a reaction to levofloxacin. Pt started taking this on 3/4, was prescribed for a sinus infection. Pt reports body and joint aches, lethargy, states her stomach is burning, and that she has swelling in both hands that all started Thursday. Pt reports she is "scared" of the medication because it's not amoxicillin that she is usually prescribed for sinus infections.

## 2022-10-09 ENCOUNTER — Other Ambulatory Visit: Payer: Self-pay

## 2022-10-09 ENCOUNTER — Emergency Department (HOSPITAL_COMMUNITY)
Admission: EM | Admit: 2022-10-09 | Discharge: 2022-10-09 | Disposition: A | Discharge status: Home / Self Care | Payer: PRIVATE HEALTH INSURANCE | Attending: Emergency Medicine | Admitting: Emergency Medicine

## 2022-10-09 DIAGNOSIS — J019 Acute sinusitis, unspecified: Secondary | ICD-10-CM | POA: Diagnosis not present

## 2022-10-09 DIAGNOSIS — R220 Localized swelling, mass and lump, head: Secondary | ICD-10-CM | POA: Diagnosis present

## 2022-10-09 LAB — CBC WITH DIFFERENTIAL/PLATELET
Abs Immature Granulocytes: 0.01 10*3/uL (ref 0.00–0.07)
Basophils Absolute: 0 10*3/uL (ref 0.0–0.1)
Basophils Relative: 0 %
Eosinophils Absolute: 0.1 10*3/uL (ref 0.0–0.5)
Eosinophils Relative: 2 %
HCT: 39.1 % (ref 36.0–46.0)
Hemoglobin: 12.9 g/dL (ref 12.0–15.0)
Immature Granulocytes: 0 %
Lymphocytes Relative: 33 %
Lymphs Abs: 1.8 10*3/uL (ref 0.7–4.0)
MCH: 29.3 pg (ref 26.0–34.0)
MCHC: 33 g/dL (ref 30.0–36.0)
MCV: 88.9 fL (ref 80.0–100.0)
Monocytes Absolute: 0.2 10*3/uL (ref 0.1–1.0)
Monocytes Relative: 4 %
Neutro Abs: 3.3 10*3/uL (ref 1.7–7.7)
Neutrophils Relative %: 61 %
Platelets: 161 10*3/uL (ref 150–400)
RBC: 4.4 MIL/uL (ref 3.87–5.11)
RDW: 13.5 % (ref 11.5–15.5)
WBC: 5.5 10*3/uL (ref 4.0–10.5)
nRBC: 0 % (ref 0.0–0.2)

## 2022-10-09 LAB — COMPREHENSIVE METABOLIC PANEL
ALT: 20 U/L (ref 0–44)
AST: 24 U/L (ref 15–41)
Albumin: 3.8 g/dL (ref 3.5–5.0)
Alkaline Phosphatase: 80 U/L (ref 38–126)
Anion gap: 8 (ref 5–15)
BUN: 10 mg/dL (ref 6–20)
CO2: 27 mmol/L (ref 22–32)
Calcium: 9.1 mg/dL (ref 8.9–10.3)
Chloride: 101 mmol/L (ref 98–111)
Creatinine, Ser: 0.79 mg/dL (ref 0.44–1.00)
GFR, Estimated: >60 mL/min (ref >60)
Glucose, Bld: 161 mg/dL — ABNORMAL HIGH (ref 70–99)
Potassium: 3.7 mmol/L (ref 3.5–5.1)
Sodium: 136 mmol/L (ref 135–145)
Total Bilirubin: 0.7 mg/dL (ref 0.3–1.2)
Total Protein: 7.2 g/dL (ref 6.5–8.1)

## 2022-10-09 NOTE — Discharge Instructions (Signed)
Start taking the Augmentin antibiotic that you were prescribed.  Follow-up with a primary care doctor to be rechecked if the symptoms do not improve in the next week

## 2022-10-09 NOTE — ED Triage Notes (Addendum)
Pt. Stated, I went to Dr. On Feb. 29 and was given a RX  and took only 3 because it has too many side effects.  When I woke up this morning with rt. Side of my face swollen and have sinusitis. Ive not had anything.

## 2022-10-09 NOTE — ED Provider Notes (Signed)
[facility] Provider Note   CSN: [id number] Arrival date & time: 10/09/22  1016     History  Chief Complaint  Patient presents with   Facial Swelling   Sinusitis    Krystal Castillo is a 57 y.o. female.   Sinusitis    Patient presents to the ED for evaluation of facial and body swelling.  Patient states she was taking an antibiotic Levaquin.  She felt that the right side of her body swelled up.  She was also complaining of pain in her ear and swelling around her jaw.  Patient has not had any trouble with breathing.  No trouble swallowing.  The symptoms have now resolved.  Home Medications Prior to Admission medications   Medication Sig Start Date End Date Taking? Authorizing Provider  albuterol (VENTOLIN HFA) 108 (90 Base) MCG/ACT inhaler Inhale 2 puffs into the lungs every 6 (six) hours as needed. 05/24/21   Saguier, Percell Miller, PA-C  atenolol (TENORMIN) 100 MG tablet Take 1 tablet (100 mg total) by mouth daily. 04/23/21   Saguier, Percell Miller, PA-C  atorvastatin (LIPITOR) 10 MG tablet Take 1 tablet by mouth once daily 08/21/21   Saguier, Percell Miller, PA-C  hydrochlorothiazide (HYDRODIURIL) 12.5 MG tablet Take 1 tablet (12.5 mg total) by mouth daily. 04/23/21   Saguier, Percell Miller, PA-C  hydrochlorothiazide (MICROZIDE) 12.5 MG capsule Take 12.5 mg by mouth daily.    [provider]  ondansetron (ZOFRAN ODT) 4 MG disintegrating tablet Take 1 tablet (4 mg total) by mouth every 8 (eight) hours as needed for nausea or vomiting. 05/22/21   Antonietta Breach, PA-C      Allergies    Metformin, Cefdinir, Shellfish allergy, Doxycycline, and Glipizide    Review of Systems   Review of Systems  Physical Exam Updated Vital Signs BP 128/75   Pulse 68   Temp 98 F (36.7 C) (Oral)   Resp 18   Ht 1.702 m ('5\' 7"'$ )   Wt 96.2 kg   SpO2 97%   BMI 33.20 kg/m  Physical Exam Vitals and nursing note reviewed.  Constitutional:      General: She is not in acute  distress.    Appearance: She is well-developed.  HENT:     Head: Normocephalic and atraumatic.     Right Ear: External ear normal.     Left Ear: External ear normal.  Eyes:     General: No scleral icterus.       Right eye: No discharge.        Left eye: No discharge.     Conjunctiva/sclera: Conjunctivae normal.  Neck:     Trachea: No tracheal deviation.  Cardiovascular:     Rate and Rhythm: Normal rate and regular rhythm.  Pulmonary:     Effort: Pulmonary effort is normal. No respiratory distress.     Breath sounds: Normal breath sounds. No stridor. No wheezing or rales.  Abdominal:     General: Bowel sounds are normal. There is no distension.     Palpations: Abdomen is soft.     Tenderness: There is no abdominal tenderness. There is no guarding or rebound.  Musculoskeletal:        General: No tenderness or deformity.     Cervical back: Neck supple.  Skin:    General: Skin is warm and dry.     Findings: No rash.     Comments: No urticaria, no rashes  Neurological:     General: No focal deficit present.  Mental Status: She is alert.     Cranial Nerves: No cranial nerve deficit, dysarthria or facial asymmetry.     Sensory: No sensory deficit.     Motor: No abnormal muscle tone or seizure activity.     Coordination: Coordination normal.  Psychiatric:        Mood and Affect: Mood normal.     ED Results / Procedures / Treatments   Labs (all labs ordered are listed, but only abnormal results are displayed) Labs Reviewed  COMPREHENSIVE METABOLIC PANEL - Abnormal; Notable for the following components:      Result Value   Glucose, Bld 161 (*)    All other components within normal limits  CBC WITH DIFFERENTIAL/PLATELET    EKG None  Radiology No results found.  Procedures Procedures    Medications Ordered in ED Medications - No data to display  ED Course/ Medical Decision Making/ A&P Clinical Course as of 10/09/22 1509  Wed Oct 09, 2022  0000000 CBC and  metabolic panel without acute abnormalities [JK]    Clinical Course User Index [JK] Dorie Rank, MD                             Medical Decision Making  Patient presented to ED with concerns of swelling after taking a new antibiotic.  On exam currently patient does not have any evidence of urticaria.  She does not have any oropharyngeal edema, no submandibular swelling.  No evidence of malignant otitis externa.  Patient does feel like her symptoms have improved.  She states she has a new antibiotic prescription for Augmentin that she was given.  She has not started that yet.  I think that is a reasonable alternative for her to take for her sinusitis as opposed to the Levaquin.  At this time there is no signs of any serious allergic reaction.  No findings to suggest severe infection or abscess.        Final Clinical Impression(s) / ED Diagnoses Final diagnoses:  Acute sinusitis, recurrence not specified, unspecified location    Rx / DC Orders ED Discharge Orders     None         Dorie Rank, MD 10/09/22 1513

## 2022-10-09 NOTE — ED Provider Triage Note (Signed)
Emergency Medicine Provider Triage Evaluation Note  [facility] , a 57 y.o. female  was evaluated in triage.  Pt complains of right sided facial and body swelling that started this morning.  Patient reports that she can see swelling and feels swelling on the right side of her body including her face, arm, torso, and leg.  No tingling sensation.  No trouble walking or talking.  No chest pain or shortness of breath. She thinks this is from the [place] she was on.   Review of Systems  Positive:  Negative:   Physical Exam  BP (!) 143/74 (BP Location: Right Arm)   Pulse 76   Temp 98.4 F (36.9 C)   Resp 18   Ht '5\' 7"'$  (1.702 m)   Wt 96.2 kg   SpO2 98%   BMI 33.20 kg/m  Gen:   Awake, no distress   Resp:  Normal effort  MSK:   Moves extremities without difficulty  Other:  Cranial nerves II through XII grossly intact.  No pronator drift.  No obvious swelling noted to extremities or neck or face.  No trismus.  Controlling secretions.  Speaking in full sentences with ease  Medical Decision Making  Medically screening exam initiated at 11:07 AM.  Appropriate orders placed.  [facility] was informed that the remainder of the evaluation will be completed by another provider, this initial triage assessment does not replace that evaluation, and the importance of remaining in the ED until their evaluation is complete.  Labs ordered.  The patient does not have any focal deficit.  Doubt any code stroke needed at this time.   Sherrell Puller, PA-C 10/09/22 1108

## 2023-02-14 ENCOUNTER — Other Ambulatory Visit: Payer: Self-pay

## 2023-02-14 ENCOUNTER — Encounter (HOSPITAL_BASED_OUTPATIENT_CLINIC_OR_DEPARTMENT_OTHER): Payer: Self-pay

## 2023-02-14 ENCOUNTER — Emergency Department (HOSPITAL_BASED_OUTPATIENT_CLINIC_OR_DEPARTMENT_OTHER)
Admission: EM | Admit: 2023-02-14 | Discharge: 2023-02-14 | Disposition: A | Discharge status: Home / Self Care | Payer: PRIVATE HEALTH INSURANCE | Attending: Emergency Medicine | Admitting: Emergency Medicine

## 2023-02-14 ENCOUNTER — Emergency Department (HOSPITAL_BASED_OUTPATIENT_CLINIC_OR_DEPARTMENT_OTHER): Payer: PRIVATE HEALTH INSURANCE

## 2023-02-14 DIAGNOSIS — I10 Essential (primary) hypertension: Secondary | ICD-10-CM | POA: Insufficient documentation

## 2023-02-14 DIAGNOSIS — R109 Unspecified abdominal pain: Secondary | ICD-10-CM | POA: Diagnosis present

## 2023-02-14 DIAGNOSIS — E119 Type 2 diabetes mellitus without complications: Secondary | ICD-10-CM | POA: Diagnosis not present

## 2023-02-14 DIAGNOSIS — Z79899 Other long term (current) drug therapy: Secondary | ICD-10-CM | POA: Insufficient documentation

## 2023-02-14 DIAGNOSIS — J45909 Unspecified asthma, uncomplicated: Secondary | ICD-10-CM | POA: Insufficient documentation

## 2023-02-14 DIAGNOSIS — R1011 Right upper quadrant pain: Secondary | ICD-10-CM | POA: Insufficient documentation

## 2023-02-14 LAB — COMPREHENSIVE METABOLIC PANEL
ALT: 26 U/L (ref 0–44)
AST: 44 U/L — ABNORMAL HIGH (ref 15–41)
Albumin: 3.7 g/dL (ref 3.5–5.0)
Alkaline Phosphatase: 68 U/L (ref 38–126)
Anion gap: 5 (ref 5–15)
BUN: 14 mg/dL (ref 6–20)
CO2: 26 mmol/L (ref 22–32)
Calcium: 8.8 mg/dL — ABNORMAL LOW (ref 8.9–10.3)
Chloride: 98 mmol/L (ref 98–111)
Creatinine, Ser: 0.91 mg/dL (ref 0.44–1.00)
GFR, Estimated: >60 mL/min (ref >60)
Glucose, Bld: 176 mg/dL — ABNORMAL HIGH (ref 70–99)
Potassium: 3.1 mmol/L — ABNORMAL LOW (ref 3.5–5.1)
Sodium: 129 mmol/L — ABNORMAL LOW (ref 135–145)
Total Bilirubin: 0.5 mg/dL (ref 0.3–1.2)
Total Protein: 7.3 g/dL (ref 6.5–8.1)

## 2023-02-14 LAB — CBC
HCT: 35.9 % — ABNORMAL LOW (ref 36.0–46.0)
Hemoglobin: 12.2 g/dL (ref 12.0–15.0)
MCH: 29.5 pg (ref 26.0–34.0)
MCHC: 34 g/dL (ref 30.0–36.0)
MCV: 86.9 fL (ref 80.0–100.0)
Platelets: 129 10*3/uL — ABNORMAL LOW (ref 150–400)
RBC: 4.13 MIL/uL (ref 3.87–5.11)
RDW: 13.7 % (ref 11.5–15.5)
WBC: 4.2 10*3/uL (ref 4.0–10.5)
nRBC: 0 % (ref 0.0–0.2)

## 2023-02-14 LAB — URINALYSIS, ROUTINE W REFLEX MICROSCOPIC
Bilirubin Urine: NEGATIVE
Glucose, UA: 100 mg/dL — AB
Hgb urine dipstick: NEGATIVE
Ketones, ur: NEGATIVE mg/dL
Leukocytes,Ua: NEGATIVE
Nitrite: NEGATIVE
Protein, ur: NEGATIVE mg/dL
Specific Gravity, Urine: 1.025 (ref 1.005–1.030)
pH: 5.5 (ref 5.0–8.0)

## 2023-02-14 LAB — LIPASE, BLOOD: Lipase: 44 U/L (ref 11–51)

## 2023-02-14 MED ORDER — IOHEXOL 300 MG/ML  SOLN
100.0000 mL | Freq: Once | INTRAMUSCULAR | Status: AC | PRN
  Administered 2023-02-14: 100 mL via INTRAVENOUS

## 2023-02-14 MED ORDER — POTASSIUM CHLORIDE 20 MEQ PO PACK
60.0000 meq | PACK | Freq: Once | ORAL | Status: AC
  Administered 2023-02-14: 60 meq via ORAL
  Filled 2023-02-14: qty 3

## 2023-02-14 NOTE — ED Triage Notes (Signed)
Patient had a colonoscopy on 7/17. She stated her DC papers advised to come to the ER for ABD pain and fever. She stated she felt hot last night but did not check her temperature. She denied V/D. She is having the ABD today. She also has a sore throat.

## 2023-02-14 NOTE — ED Provider Notes (Signed)
[place] [facility] AT [facility] Provider Note  CSN: [id number] Arrival date & time: 02/14/23 1512  Chief Complaint(s) Abdominal Pain  HPI Krystal Castillo is a 57 y.o. female history of diabetes, hypertension presenting to the emergency department for abdominal pain.  Reports that she had right upper quadrant abdominal pain, reports that he had a colonoscopy 2 days ago, they instructed her to come to the emergency department given that she was having abdominal pain.  She reports that the pain has significantly improved since last night.  She reports that she has not had any nausea, vomiting, diarrhea, chest pain, shortness of breath, leg swelling, melena, hematochezia, or any other new symptoms.  Reports normal appetite, normal p.o. intake.  She reports her pain is very mild.  Past Medical History Past Medical History:  Diagnosis Date   Diabetes mellitus without complication (HCC)    Hypertension    Patient Active Problem List   Diagnosis Date Noted   Type 2 diabetes mellitus (HCC) 11/28/2020   Uncontrolled type 2 diabetes mellitus with hyperglycemia, without long-term current use of insulin (HCC) 02/09/2018   Female bladder prolapse 10/15/2017   Benign hypertension 01/02/2013   Perennial allergic rhinitis with seasonal variation 01/02/2013   Mild intermittent asthma without complication 01/02/2013   Home Medication(s) Prior to Admission medications   Medication Sig Start Date End Date Taking? Authorizing Provider  albuterol (VENTOLIN HFA) 108 (90 Base) MCG/ACT inhaler Inhale 2 puffs into the lungs every 6 (six) hours as needed. 05/24/21   Saguier, Ramon Dredge, PA-C  atenolol (TENORMIN) 100 MG tablet Take 1 tablet (100 mg total) by mouth daily. 04/23/21   Saguier, Ramon Dredge, PA-C  atorvastatin (LIPITOR) 10 MG tablet Take 1 tablet by mouth once daily 08/21/21   Saguier, Ramon Dredge, PA-C  hydrochlorothiazide (HYDRODIURIL) 12.5 MG tablet Take 1 tablet (12.5 mg total) by mouth  daily. 04/23/21   Saguier, Ramon Dredge, PA-C  hydrochlorothiazide (MICROZIDE) 12.5 MG capsule Take 12.5 mg by mouth daily.    [provider]  ondansetron (ZOFRAN ODT) 4 MG disintegrating tablet Take 1 tablet (4 mg total) by mouth every 8 (eight) hours as needed for nausea or vomiting. 05/22/21   Antony Madura, PA-C                                                                                                                                    Past Surgical History Past Surgical History:  Procedure Laterality Date   CHOLECYSTECTOMY     FOOT SURGERY     THYROID SURGERY     TONSILLECTOMY     TUBAL LIGATION     Family History History reviewed. No pertinent family history.  Social History Social History   Tobacco Use   Smoking status: Never   Smokeless tobacco: Never  Vaping Use   Vaping status: Never Used  Substance Use Topics   Alcohol use: No   Drug use: No  Allergies Metformin, Cefdinir, Shellfish allergy, Doxycycline, and Glipizide  Review of Systems Review of Systems  All other systems reviewed and are negative.   Physical Exam Vital Signs  I have reviewed the triage vital signs BP 121/72   Pulse 70   Temp 98.1 F (36.7 C)   Resp 17   Ht 5\' 7"  (1.702 m)   Wt 96 kg   SpO2 97%   BMI 33.15 kg/m  Physical Exam Vitals and nursing note reviewed.  Constitutional:      General: She is not in acute distress.    Appearance: She is well-developed.  HENT:     Head: Normocephalic and atraumatic.     Mouth/Throat:     Mouth: Mucous membranes are moist.     Pharynx: No pharyngeal swelling or oropharyngeal exudate.  Eyes:     Pupils: Pupils are equal, round, and reactive to light.  Cardiovascular:     Rate and Rhythm: Normal rate and regular rhythm.     Heart sounds: No murmur heard. Pulmonary:     Effort: Pulmonary effort is normal. No respiratory distress.     Breath sounds: Normal breath sounds.  Abdominal:     General: Abdomen is flat.     Palpations:  Abdomen is soft.     Tenderness: There is no abdominal tenderness.  Musculoskeletal:        General: No tenderness.     Right lower leg: No edema.     Left lower leg: No edema.  Skin:    General: Skin is warm and dry.  Neurological:     General: No focal deficit present.     Mental Status: She is alert. Mental status is at baseline.  Psychiatric:        Mood and Affect: Mood normal.        Behavior: Behavior normal.     ED Results and Treatments Labs (all labs ordered are listed, but only abnormal results are displayed) Labs Reviewed  COMPREHENSIVE METABOLIC PANEL - Abnormal; Notable for the following components:      Result Value   Sodium 129 (*)    Potassium 3.1 (*)    Glucose, Bld 176 (*)    Calcium 8.8 (*)    AST 44 (*)    All other components within normal limits  CBC - Abnormal; Notable for the following components:   HCT 35.9 (*)    Platelets 129 (*)    All other components within normal limits  URINALYSIS, ROUTINE W REFLEX MICROSCOPIC - Abnormal; Notable for the following components:   Glucose, UA 100 (*)    All other components within normal limits  LIPASE, BLOOD                                                                                                                          Radiology CT ABDOMEN PELVIS W CONTRAST  Result Date: 02/14/2023 CLINICAL DATA:  Acute abdominal pain EXAM: CT ABDOMEN AND PELVIS WITH  CONTRAST TECHNIQUE: Multidetector CT imaging of the abdomen and pelvis was performed using the standard protocol following bolus administration of intravenous contrast. RADIATION DOSE REDUCTION: This exam was performed according to the departmental dose-optimization program which includes automated exposure control, adjustment of the mA and/or kV according to patient size and/or use of iterative reconstruction technique. CONTRAST:  OMNIPAQUE IOHEXOL 300 MG/ML  SOLN COMPARISON:  CT abdomen and pelvis 09/18/2020 FINDINGS: Lower chest: No acute  abnormality. Hepatobiliary: There is diffuse fatty infiltration of the liver. The gallbladder surgically absent. There is no biliary ductal dilatation. Pancreas: Unremarkable. No pancreatic ductal dilatation or surrounding inflammatory changes. Spleen: Normal in size without focal abnormality. Adrenals/Urinary Tract: Adrenal glands are unremarkable. Kidneys are normal, without renal calculi, focal lesion, or hydronephrosis. Bladder is unremarkable. Stomach/Bowel: Stomach is within normal limits. Appendix appears normal. No evidence of bowel wall thickening, distention, or inflammatory changes. There is sigmoid and descending colon diverticulosis. Vascular/Lymphatic: Aortic atherosclerosis. No enlarged abdominal or pelvic lymph nodes. Reproductive: Uterus and bilateral adnexa are unremarkable. Other: No abdominal wall hernia or abnormality. No abdominopelvic ascites. Musculoskeletal: Multilevel degenerative changes affect the spine. IMPRESSION: 1. No acute localizing process in the abdomen or pelvis. 2. Fatty infiltration of the liver. 3. Colonic diverticulosis without evidence for diverticulitis. Aortic Atherosclerosis (ICD10-I70.0). Electronically Signed   By: Darliss Cheney M.D.   On: 02/14/2023 18:59    Pertinent labs & imaging results that were available during my care of the patient were reviewed by me and considered in my medical decision making (see MDM for details).  Medications Ordered in ED Medications  potassium chloride (KLOR-CON) packet 60 mEq (60 mEq Oral Given 02/14/23 1724)  iohexol (OMNIPAQUE) 300 MG/ML solution 100 mL (100 mLs Intravenous Contrast Given 02/14/23 1835)                                                                                                                                     Procedures Procedures  (including critical care time)  Medical Decision Making / ED Course   MDM:  57 year old female presenting to the emergency department with abdominal  pain.  Patient well-appearing, physical exam benign with no abdominal tenderness.  She also reported mild sore throat to the triage, no sign of any dangerous process.  Vitals are reassuring, no fever or tachycardia.  She reported subjective fever at home.  Given recent colonoscopy, will proceed with CT scan to evaluate for intra-abdominal process such as perforation given recent colonoscopy although very low concern given benign abdominal exam and minimal symptoms.  Patient declined any pain medication in the emergency department.  Lower concern for biliary colic or cholecystitis given minimal symptoms, no nausea or vomiting but will check LFTs as well.  Will reassess.  If CT is reassuring and her laboratory testing is benign, anticipate discharge with follow-up with her outpatient GI physician.  Clinical Course as of 02/14/23 1914  Caleen Essex Feb 14, 2023  1911 CT scan negative for acute process, shows fatty liver.  Discussed this with the patient.  Patient has not had prior cholecystectomy.  She reports currently she feels well.  She had mild hypokalemia which was repleted.  Discussed this with the patient.  Given reassuring workup, minimal ongoing symptoms, will discharge patient to home. All questions answered. Patient comfortable with plan of discharge. Return precautions discussed with patient and specified on the after visit summary.  [WS]    Clinical Course User Index [WS] Lonell Grandchild, MD     Additional history obtained: -External records from outside source obtained and reviewed including: Chart review including previous notes, labs, imaging, consultation notes including [facility] telephone call   Lab Tests: -I ordered, reviewed, and interpreted labs.   The pertinent results include:   Labs Reviewed  COMPREHENSIVE METABOLIC PANEL - Abnormal; Notable for the following components:      Result Value   Sodium 129 (*)    Potassium 3.1 (*)    Glucose, Bld 176 (*)    Calcium 8.8 (*)     AST 44 (*)    All other components within normal limits  CBC - Abnormal; Notable for the following components:   HCT 35.9 (*)    Platelets 129 (*)    All other components within normal limits  URINALYSIS, ROUTINE W REFLEX MICROSCOPIC - Abnormal; Notable for the following components:   Glucose, UA 100 (*)    All other components within normal limits  LIPASE, BLOOD    Notable for mild hyponatremia, hypokalemia   Imaging Studies ordered: I ordered imaging studies including CT Abdomen On my interpretation imaging demonstrates no acute process I independently visualized and interpreted imaging. I agree with the radiologist interpretation   Medicines ordered and prescription drug management: Meds ordered this encounter  Medications   potassium chloride (KLOR-CON) packet 60 mEq   iohexol (OMNIPAQUE) 300 MG/ML solution 100 mL    -I have reviewed the patients home medicines and have made adjustments as needed   Social Determinants of Health:  Diagnosis or treatment significantly limited by social determinants of health: obesity   Reevaluation:  I reevaluated the patient and found that their symptoms have resolved  Co morbidities that complicate the patient evaluation  Past Medical History:  Diagnosis Date   Diabetes mellitus without complication (HCC)    Hypertension       Dispostion: Disposition decision including need for hospitalization was considered, and patient discharged from emergency department.    Final Clinical Impression(s) / ED Diagnoses Final diagnoses:  Right upper quadrant abdominal pain     This chart was dictated using voice recognition software.  Despite best efforts to proofread,  errors can occur which can change the documentation meaning.    Lonell Grandchild, MD 02/14/23 [phone]

## 2023-02-14 NOTE — Discharge Instructions (Addendum)
We evaluated you for your abdominal pain.  Your CT scan did not show any dangerous process.  Your labs are overall reassuring.  Your potassium was slightly low and your sodium was slightly low.  Please follow-up with your primary doctor for recheck of your labs in around a week.  If you have any recurrent pain at home, please take 1000 mg of Tylenol every 6 hours.  If you have any change in your pain or new pain such as chest pain, difficulty breathing, severely worsening pain, fainting, fevers or chills, or any other new concerning symptoms, please return to the emergency department.

## 2023-05-28 ENCOUNTER — Encounter (HOSPITAL_BASED_OUTPATIENT_CLINIC_OR_DEPARTMENT_OTHER): Payer: Self-pay

## 2023-05-28 ENCOUNTER — Other Ambulatory Visit: Payer: Self-pay

## 2023-05-28 ENCOUNTER — Emergency Department (HOSPITAL_BASED_OUTPATIENT_CLINIC_OR_DEPARTMENT_OTHER)
Admission: EM | Admit: 2023-05-28 | Discharge: 2023-05-28 | Disposition: A | Discharge status: Home / Self Care | Payer: No Typology Code available for payment source | Attending: Emergency Medicine | Admitting: Emergency Medicine

## 2023-05-28 DIAGNOSIS — E119 Type 2 diabetes mellitus without complications: Secondary | ICD-10-CM | POA: Insufficient documentation

## 2023-05-28 DIAGNOSIS — I10 Essential (primary) hypertension: Secondary | ICD-10-CM | POA: Diagnosis not present

## 2023-05-28 DIAGNOSIS — M5441 Lumbago with sciatica, right side: Secondary | ICD-10-CM | POA: Insufficient documentation

## 2023-05-28 DIAGNOSIS — S161XXA Strain of muscle, fascia and tendon at neck level, initial encounter: Secondary | ICD-10-CM | POA: Insufficient documentation

## 2023-05-28 DIAGNOSIS — Y92410 Unspecified street and highway as the place of occurrence of the external cause: Secondary | ICD-10-CM | POA: Diagnosis not present

## 2023-05-28 DIAGNOSIS — S199XXA Unspecified injury of neck, initial encounter: Secondary | ICD-10-CM | POA: Diagnosis present

## 2023-05-28 DIAGNOSIS — M5442 Lumbago with sciatica, left side: Secondary | ICD-10-CM | POA: Insufficient documentation

## 2023-05-28 MED ORDER — LIDOCAINE 5 % EX PTCH: 1.0000 | MEDICATED_PATCH | CUTANEOUS | 0 refills | Status: DC

## 2023-05-28 MED ORDER — METHOCARBAMOL 500 MG PO TABS: 500.0000 mg | ORAL_TABLET | Freq: Two times a day (BID) | ORAL | 0 refills | Status: AC

## 2023-05-28 NOTE — ED Notes (Signed)
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
## 2023-05-28 NOTE — Discharge Instructions (Addendum)
Please use Tylenol or ibuprofen for pain.  You may use 600 mg ibuprofen every 6 hours or 1000 mg of Tylenol every 6 hours.  You may choose to alternate between the 2.  This would be most effective.  Not to exceed 4 g of Tylenol within 24 hours.  Not to exceed 3200 mg ibuprofen 24 hours.  You can use the muscle relaxant I am prescribing in addition to the above to help with any breakthrough pain.  You can take it up to twice daily.  It is safe to take at night, but I would be cautious taking it during the day as it can cause some drowsiness.  Make sure that you are feeling awake and alert before you get behind the wheel of a car or operate a motor vehicle.  It is not a narcotic pain medication so you are able to take it if it is not making you drowsy and still pilot a vehicle or machinery safely.  If you are still having ongoing pain in another week I recommend following up with the orthopedic doctor whose contact information I provided above.  If you develop numbness in your groin, loss of control of your bladder or bowels please return for further evaluation.
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
## 2024-04-21 ENCOUNTER — Encounter (HOSPITAL_BASED_OUTPATIENT_CLINIC_OR_DEPARTMENT_OTHER): Payer: Self-pay | Admitting: Emergency Medicine

## 2024-04-21 ENCOUNTER — Other Ambulatory Visit: Payer: Self-pay

## 2024-04-21 ENCOUNTER — Emergency Department (HOSPITAL_BASED_OUTPATIENT_CLINIC_OR_DEPARTMENT_OTHER)
Admission: EM | Admit: 2024-04-21 | Discharge: 2024-04-22 | Disposition: A | Discharge status: Home / Self Care | Attending: Emergency Medicine | Admitting: Emergency Medicine

## 2024-04-21 ENCOUNTER — Emergency Department (HOSPITAL_BASED_OUTPATIENT_CLINIC_OR_DEPARTMENT_OTHER)

## 2024-04-21 DIAGNOSIS — R0789 Other chest pain: Secondary | ICD-10-CM | POA: Diagnosis not present

## 2024-04-21 DIAGNOSIS — M25571 Pain in right ankle and joints of right foot: Secondary | ICD-10-CM | POA: Insufficient documentation

## 2024-04-21 DIAGNOSIS — E119 Type 2 diabetes mellitus without complications: Secondary | ICD-10-CM | POA: Diagnosis not present

## 2024-04-21 DIAGNOSIS — G8929 Other chronic pain: Secondary | ICD-10-CM | POA: Insufficient documentation

## 2024-04-21 DIAGNOSIS — I10 Essential (primary) hypertension: Secondary | ICD-10-CM | POA: Diagnosis not present

## 2024-04-21 LAB — CBC WITH DIFFERENTIAL/PLATELET
Abs Immature Granulocytes: 0.02 K/uL (ref 0.00–0.07)
Basophils Absolute: 0 K/uL (ref 0.0–0.1)
Basophils Relative: 0 %
Eosinophils Absolute: 0.2 K/uL (ref 0.0–0.5)
Eosinophils Relative: 2 %
HCT: 39 % (ref 36.0–46.0)
Hemoglobin: 13.5 g/dL (ref 12.0–15.0)
Immature Granulocytes: 0 %
Lymphocytes Relative: 31 %
Lymphs Abs: 2.4 K/uL (ref 0.7–4.0)
MCH: 29.6 pg (ref 26.0–34.0)
MCHC: 34.6 g/dL (ref 30.0–36.0)
MCV: 85.5 fL (ref 80.0–100.0)
Monocytes Absolute: 0.4 K/uL (ref 0.1–1.0)
Monocytes Relative: 5 %
Neutro Abs: 4.6 K/uL (ref 1.7–7.7)
Neutrophils Relative %: 62 %
Platelets: 162 K/uL (ref 150–400)
RBC: 4.56 MIL/uL (ref 3.87–5.11)
RDW: 13.3 % (ref 11.5–15.5)
WBC: 7.6 K/uL (ref 4.0–10.5)
nRBC: 0 % (ref 0.0–0.2)

## 2024-04-21 LAB — BASIC METABOLIC PANEL WITH GFR
Anion gap: 13 (ref 5–15)
BUN: 13 mg/dL (ref 6–20)
CO2: 24 mmol/L (ref 22–32)
Calcium: 9.8 mg/dL (ref 8.9–10.3)
Chloride: 100 mmol/L (ref 98–111)
Creatinine, Ser: 0.88 mg/dL (ref 0.44–1.00)
GFR, Estimated: >60 mL/min (ref >60)
Glucose, Bld: 162 mg/dL — ABNORMAL HIGH (ref 70–99)
Potassium: 3.6 mmol/L (ref 3.5–5.1)
Sodium: 138 mmol/L (ref 135–145)

## 2024-04-21 LAB — TROPONIN T, HIGH SENSITIVITY: Troponin T High Sensitivity: <15 ng/L (ref 0–19)

## 2024-04-21 NOTE — ED Triage Notes (Signed)
 Pt c/o RT ankle pain, no injury; also reports chest heaviness that started tonight; denies other sxs

## 2024-04-22 NOTE — Discharge Instructions (Signed)
 You were evaluated in the Emergency Department and after careful evaluation, we did not find any emergent condition requiring admission or further testing in the hospital.  Your exam/testing today is overall reassuring.  Regarding your chest heaviness recommend discussing this symptom with your primary care doctor.  They may want to do further outpatient testing.  For your ankle recommend continued use of Tylenol  or Motrin , rest as able, can follow-up with orthopedic specialist if not improving over the next few weeks.  Please return to the Emergency Department if you experience any worsening of your condition.   Thank you for allowing us  to be a part of your care.

## 2024-04-22 NOTE — ED Provider Notes (Signed)
 [facility]PT [facility] Emergency Department Provider Note MRN:  [id number]  Arrival date & time: 04/22/24     Chief Complaint   Ankle Pain and Chest Pain   History of Present Illness   Krystal Castillo is a 58 y.o. year-old female with a history of hypertension, diabetes presenting to the ED with chief complaint of ankle pain.  Patient here for acute on chronic ankle pain.  Says that it hurts on and off with the weather for a long time, worse today as well.  Denies trauma today.  Had some chest heaviness earlier this afternoon that was brief without any other associated symptoms.  Review of Systems  A thorough review of systems was obtained and all systems are negative except as noted in the HPI and PMH.   Patient's Health History    Past Medical History:  Diagnosis Date   Diabetes mellitus without complication (HCC)    Hypertension     Past Surgical History:  Procedure Laterality Date   CHOLECYSTECTOMY     FOOT SURGERY     THYROID SURGERY     TONSILLECTOMY     TUBAL LIGATION      History reviewed. No pertinent family history.  Social History   Socioeconomic History   Marital status: Single    Spouse name: Not on file   Number of children: Not on file   Years of education: Not on file   Highest education level: Not on file  Occupational History   Not on file  Tobacco Use   Smoking status: Never   Smokeless tobacco: Never  Vaping Use   Vaping status: Never Used  Substance and Sexual Activity   Alcohol use: No   Drug use: No   Sexual activity: Not on file  Other Topics Concern   Not on file  Social History Narrative   Not on file   Social Drivers of Health   Financial Resource Strain: Not on file  Food Insecurity: Low Risk  (04/01/2024)   Received from [facility]   Hunger Vital Sign    Within the past 12 months, you worried that your food would run out before you got money to buy more: Never true    Within the past 12 months, the food  you bought just didn't last and you didn't have money to get more. : Never true  Transportation Needs: No Transportation Needs (04/01/2024)   Received from [employer]    In the past 12 months, has lack of reliable transportation kept you from medical appointments, meetings, work or from getting things needed for daily living? : No  Physical Activity: Not on file  Stress: Not on file  Social Connections: Not on file  Intimate Partner Violence: Not on file     Physical Exam   Vitals:   04/21/24 2155  BP: (!) 166/86  Pulse: 99  Resp: 18  Temp: 98.2 F (36.8 C)  SpO2: 99%    CONSTITUTIONAL: Well-appearing, NAD NEURO/PSYCH:  Alert and oriented x 3, no focal deficits EYES:  eyes equal and reactive ENT/NECK:  no LAD, no JVD CARDIO: Regular rate, well-perfused, normal S1 and S2 PULM:  CTAB no wheezing or rhonchi GI/GU:  non-distended, non-tender MSK/SPINE:  No gross deformities, no edema SKIN:  no rash, atraumatic   *Additional and/or pertinent findings included in MDM below  Diagnostic and Interventional Summary    EKG Interpretation Date/Time:  Wednesday April 21 2024 21:57:41 EDT Ventricular Rate:  96  PR Interval:  167 QRS Duration:  93 QT Interval:  371 QTC Calculation: 469 R Axis:   36  Text Interpretation: Sinus rhythm Consider left atrial enlargement Abnormal R-wave progression, early transition ST elevation, consider inferior injury similar to prior EKGs Confirmed by Lenor Hollering [phone]) on 04/21/2024 9:59:58 PM       Labs Reviewed  BASIC METABOLIC PANEL WITH GFR - Abnormal; Notable for the following components:      Result Value   Glucose, Bld 162 (*)    All other components within normal limits  CBC WITH DIFFERENTIAL/PLATELET  TROPONIN T, HIGH SENSITIVITY    DG Chest 2 View  Final Result    DG Ankle Complete Right  Final Result      Medications - No data to display   Procedures  /  Critical Care Procedures  ED Course and  Medical Decision Making  Initial Impression and Ddx Acute on chronic ankle pain, normal range of motion, neurovascularly intact, not really any tenderness on exam, no swelling, no erythema.  Suspect arthritis or other benign process.  Nothing to suggest infectious process, no signs of DVT.  Chest heaviness earlier today brief without any associated symptoms.  ACS considered, patient feels like it is most likely indigestion.  A few cardiovascular risk factors, will need EKG, troponin   Past medical/surgical history that increases complexity of ED encounter: Hypertension, diabetes  Interpretation of Diagnostics I personally reviewed the EKG and my interpretation is as follows: Sinus rhythm, nonspecific findings with similar EKGs on record  No significant blood count or electrolyte disturbance.  Troponin negative  Patient Reassessment and Ultimate Disposition/Management     With reassuring workup patient appropriate for discharge.  Patient management required discussion with the following services or consulting groups:  None  Complexity of Problems Addressed Acute illness or injury that poses threat of life of bodily function  Additional Data Reviewed and Analyzed Further history obtained from: Prior labs/imaging results  Additional Factors Impacting ED Encounter Risk None  Ozell HERO. Theadore, MD [facility] Emergency Medicine [facility] [email or web address]  Final Clinical Impressions(s) / ED Diagnoses     ICD-10-CM   1. Chest heaviness  R07.89     2. Chronic pain of right ankle  M25.571    G89.29       ED Discharge Orders     None        Discharge Instructions Discussed with and Provided to Patient:    Discharge Instructions      You were evaluated in the Emergency Department and after careful evaluation, we did not find any emergent condition requiring admission or further testing in the hospital.  Your exam/testing today is overall reassuring.   Regarding your chest heaviness recommend discussing this symptom with your primary care doctor.  They may want to do further outpatient testing.  For your ankle recommend continued use of Tylenol  or Motrin , rest as able, can follow-up with orthopedic specialist if not improving over the next few weeks.  Please return to the Emergency Department if you experience any worsening of your condition.   Thank you for allowing us  to be a part of your care.      Theadore Ozell HERO, MD 04/22/24 [place]

## 2024-04-25 ENCOUNTER — Emergency Department (HOSPITAL_BASED_OUTPATIENT_CLINIC_OR_DEPARTMENT_OTHER)
Admission: EM | Admit: 2024-04-25 | Discharge: 2024-04-25 | Disposition: A | Discharge status: Home / Self Care | Attending: Emergency Medicine | Admitting: Emergency Medicine

## 2024-04-25 ENCOUNTER — Emergency Department (HOSPITAL_BASED_OUTPATIENT_CLINIC_OR_DEPARTMENT_OTHER)

## 2024-04-25 ENCOUNTER — Other Ambulatory Visit: Payer: Self-pay

## 2024-04-25 ENCOUNTER — Encounter (HOSPITAL_BASED_OUTPATIENT_CLINIC_OR_DEPARTMENT_OTHER): Payer: Self-pay

## 2024-04-25 DIAGNOSIS — I10 Essential (primary) hypertension: Secondary | ICD-10-CM | POA: Diagnosis not present

## 2024-04-25 DIAGNOSIS — Z79899 Other long term (current) drug therapy: Secondary | ICD-10-CM | POA: Insufficient documentation

## 2024-04-25 DIAGNOSIS — W19XXXA Unspecified fall, initial encounter: Secondary | ICD-10-CM

## 2024-04-25 DIAGNOSIS — E119 Type 2 diabetes mellitus without complications: Secondary | ICD-10-CM | POA: Diagnosis not present

## 2024-04-25 DIAGNOSIS — W1830XA Fall on same level, unspecified, initial encounter: Secondary | ICD-10-CM | POA: Insufficient documentation

## 2024-04-25 DIAGNOSIS — M533 Sacrococcygeal disorders, not elsewhere classified: Secondary | ICD-10-CM | POA: Diagnosis present

## 2024-04-25 MED ORDER — LIDOCAINE 5 % EX PTCH: 1.0000 | MEDICATED_PATCH | CUTANEOUS | 0 refills | Status: AC

## 2024-04-25 MED ORDER — KETOROLAC TROMETHAMINE 60 MG/2ML IM SOLN
60.0000 mg | Freq: Once | INTRAMUSCULAR | Status: AC
  Administered 2024-04-25: 60 mg via INTRAMUSCULAR
  Filled 2024-04-25: qty 2

## 2024-04-25 MED ORDER — IBUPROFEN 800 MG PO TABS: 800.0000 mg | ORAL_TABLET | Freq: Three times a day (TID) | ORAL | 0 refills | Status: AC | PRN

## 2024-04-25 NOTE — ED Provider Notes (Signed)
 [place] EM[facility] AT [facility] Provider Note   CSN: [id number] Arrival date & time: 04/25/24  1036     Patient presents with: [facility] is a 58 y.o. female.  {Add pertinent medical, surgical, social history, OB history to HPI:32947} HPI      58 year old female with a history of diabetes and hypertension presents with concern for fall.  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  Reports she avoided hitting her head on the organ by using her arm, and ended up falling on her back and bottom.  People at church and said that she sort of twisted as she fell.  She has had significant pain trying to sit down since then, with pain primarily in her tailbone.  She has been able to ambulate, however with pain.  Took 1000 mg of Tylenol  prior to arrival.  The pain continued and worsened since yesterday which prompted her to come to the emergency department.  She denies hitting her head, anticoagulation, headache, neck pain, numbness, weakness, chest pain or abdominal pain.  She denies any loss of control of her bowel or bladder.  Reports has been eating a lot of fiber cereal and fruit and has been having more bowel movements but not diarrhea.  She tried putting heating pads on in addition to taking Tylenol .  Has some mild soreness in her left arm but reports it is okay is more concerned regarding the pain of her pelvis      Past Medical History:  Diagnosis Date   Diabetes mellitus without complication (HCC)    Hypertension      Prior to Admission medications   Medication Sig Start Date End Date Taking? Authorizing Provider  albuterol  (VENTOLIN  HFA) 108 (90 Base) MCG/ACT inhaler Inhale 2 puffs into the lungs every 6 (six) hours as needed. 05/24/21   Saguier, [place], PA-C  atenolol  (TENORMIN ) 100 MG tablet Take  1 tablet (100 mg total) by mouth daily. 04/23/21   Saguier, [place], PA-C  atorvastatin  (LIPITOR) 10 MG tablet Take 1 tablet by mouth once daily 08/21/21   Saguier, [place], PA-C  hydrochlorothiazide  (HYDRODIURIL ) 12.5 MG tablet Take 1 tablet (12.5 mg total) by mouth daily. 04/23/21   Saguier, [place], PA-C  hydrochlorothiazide  (MICROZIDE ) 12.5 MG capsule Take 12.5 mg by mouth daily.    [provider]  lidocaine  (LIDODERM ) 5 % Place 1 patch onto the skin daily. Remove & Discard patch within 12 hours or as directed by MD 05/28/23   Prosperi, Sherlean H, PA-C  methocarbamol  (ROBAXIN ) 500 MG tablet Take 1 tablet (500 mg total) by mouth 2 (two) times daily. 05/28/23   Prosperi, Christian H, PA-C  ondansetron  (ZOFRAN  ODT) 4 MG disintegrating tablet Take 1 tablet (4 mg total) by mouth every 8 (eight) hours as needed for nausea or vomiting. 05/22/21   Keith Sor, PA-C    Allergies: Metformin, Cefdinir, Shellfish allergy, Doxycycline, and Glipizide    Review of Systems  Updated Vital Signs BP (!) 155/86   Pulse 73   Temp 98.4 F (36.9 C)   Resp 18   LMP 07/24/2017 (Approximate)   SpO2 100%   Physical Exam  (all labs ordered are listed, but only abnormal results are displayed) Labs Reviewed - No data to display  EKG: None  Radiology: No results found.  {Document cardiac monitor,  telemetry assessment procedure when appropriate:32947} Procedures   Medications Ordered in the ED  ketorolac  (TORADOL ) injection 60 mg (60 mg Intramuscular Given 04/25/24 1207)      {Click here for ABCD2, HEART and other calculators REFRESH Note before signing:1}                              Medical Decision Making Amount and/or Complexity of Data Reviewed Radiology: ordered.  Risk Prescription drug management.    58 year old female with a history of diabetes and hypertension presents with concern for fall.  Low suspicion for injuries to the head, cervical spine, chest, abdomen or thoracic  spine by history and exam.  She is neurologically intact.  X-ray of the lumbar, pelvis and coccyx returned showing***   {Document critical care time when appropriate  Document review of labs and clinical decision tools ie CHADS2VASC2, etc  Document your independent review of radiology images and any outside records  Document your discussion with family members, caretakers and with consultants  Document social determinants of health affecting pt's care  Document your decision making why or why not admission, treatments were needed:32947:::1}   Final diagnoses:  None    ED Discharge Orders     None

## 2024-04-25 NOTE — ED Triage Notes (Signed)
 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 Unsure how she fell off altar . pt was shouting at the time. Fell back onto [employer] Onlookers reported she appeared to twist as she fell back. Complaining sacral pain. Denies loss of consciousness. No thinners. Left arm sore
